# Patient Record
Sex: Female | Born: 1975 | Race: White | Hispanic: No | Smoking: Current every day smoker
Health system: Southern US, Academic
[De-identification: ages and names within clinical notes are randomized; demographics above are authoritative.]

## PROBLEM LIST (undated history)

## (undated) DIAGNOSIS — I639 Cerebral infarction, unspecified: Secondary | ICD-10-CM

## (undated) DIAGNOSIS — B977 Papillomavirus as the cause of diseases classified elsewhere: Secondary | ICD-10-CM

## (undated) DIAGNOSIS — G43909 Migraine, unspecified, not intractable, without status migrainosus: Secondary | ICD-10-CM

## (undated) DIAGNOSIS — R937 Abnormal findings on diagnostic imaging of other parts of musculoskeletal system: Secondary | ICD-10-CM

## (undated) DIAGNOSIS — G629 Polyneuropathy, unspecified: Secondary | ICD-10-CM

## (undated) DIAGNOSIS — C539 Malignant neoplasm of cervix uteri, unspecified: Secondary | ICD-10-CM

## (undated) DIAGNOSIS — R930 Abnormal findings on diagnostic imaging of skull and head, not elsewhere classified: Secondary | ICD-10-CM

## (undated) DIAGNOSIS — M62838 Other muscle spasm: Secondary | ICD-10-CM

## (undated) DIAGNOSIS — F411 Generalized anxiety disorder: Secondary | ICD-10-CM

## (undated) DIAGNOSIS — D649 Anemia, unspecified: Secondary | ICD-10-CM

## (undated) DIAGNOSIS — E7800 Pure hypercholesterolemia, unspecified: Secondary | ICD-10-CM

## (undated) DIAGNOSIS — R569 Unspecified convulsions: Secondary | ICD-10-CM

## (undated) DIAGNOSIS — N63 Unspecified lump in unspecified breast: Secondary | ICD-10-CM

## (undated) DIAGNOSIS — F32A Depression, unspecified: Secondary | ICD-10-CM

## (undated) HISTORY — PX: HX DILATION AND CURETTAGE: SHX78

## (undated) HISTORY — DX: Depression, unspecified: F32.A

## (undated) HISTORY — DX: Anemia, unspecified: D64.9

## (undated) HISTORY — DX: Abnormal findings on diagnostic imaging of skull and head, not elsewhere classified: R93.0

## (undated) HISTORY — DX: Unspecified convulsions: R56.9

## (undated) HISTORY — DX: Migraine, unspecified, not intractable, without status migrainosus: G43.909

## (undated) HISTORY — PX: DENTAL SURGERY: SHX609

## (undated) HISTORY — DX: Papillomavirus as the cause of diseases classified elsewhere: B97.7

## (undated) HISTORY — DX: Unspecified lump in unspecified breast: N63.0

## (undated) HISTORY — DX: Generalized anxiety disorder: F41.1

## (undated) HISTORY — DX: Cerebral infarction, unspecified: I63.9

## (undated) HISTORY — DX: Pure hypercholesterolemia, unspecified: E78.00

## (undated) HISTORY — DX: Polyneuropathy, unspecified: G62.9

## (undated) HISTORY — PX: VULVA SURGERY: SHX837

## (undated) HISTORY — DX: Abnormal findings on diagnostic imaging of other parts of musculoskeletal system: R93.7

---
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
## 2016-03-08 ENCOUNTER — Ambulatory Visit (INDEPENDENT_AMBULATORY_CARE_PROVIDER_SITE_OTHER): Payer: Self-pay | Admitting: Neurology

## 2016-06-21 ENCOUNTER — Ambulatory Visit: Payer: BC Managed Care – PPO | Attending: Neurology | Admitting: Neurology

## 2016-06-21 ENCOUNTER — Encounter (INDEPENDENT_AMBULATORY_CARE_PROVIDER_SITE_OTHER): Payer: Self-pay | Admitting: Neurology

## 2016-06-21 VITALS — BP 98/64 | HR 94 | Ht 59.0 in | Wt 130.3 lb

## 2016-06-21 DIAGNOSIS — F17210 Nicotine dependence, cigarettes, uncomplicated: Secondary | ICD-10-CM | POA: Insufficient documentation

## 2016-06-21 DIAGNOSIS — Z6826 Body mass index (BMI) 26.0-26.9, adult: Secondary | ICD-10-CM

## 2016-06-21 DIAGNOSIS — G43109 Migraine with aura, not intractable, without status migrainosus: Secondary | ICD-10-CM | POA: Insufficient documentation

## 2016-06-21 DIAGNOSIS — R51 Headache: Secondary | ICD-10-CM

## 2016-06-21 DIAGNOSIS — R519 Headache, unspecified: Secondary | ICD-10-CM

## 2016-06-21 NOTE — H&P (Signed)
[facility]  Outpatient History and Physical    Date:  06/21/2016  Name: Jasmine Brown  Age:  40 y.o.  Referring Physician:   Blanca Friend, MD  [address]  [place], [place] [address]    History of Present Illness  History obtained from:  patient    Jasmine Brown is a 40 y.o. female with h/o recurrent Bells palsy ( right side ), Herpes Zoster in her ear , post herpetic neuralgia , migrianes who presents with a chief complaint of   Chief Complaint   Patient presents with    New Patient    Headache      Type,nature and frequency of headache-  These headaches began approximately 25  year(s) ago.  They occur approximately 30 times per month and can last up to 4 days .   They are located on bilateral temporal, back of head and top of head and are described as a Stabbing pain  type of pain.She gets a stabbing type of pain as well behind her eye, lasts a few hours. She has right sided facial droop with the severe headaches. Slurred speech and word finding difficulty.  Intensity of the pain ranges from 10  /10   Association-There is associated Aura ( Zig zag lines )  ,photophobia,phonophobia,nausea and vomiting. Vomiting does not make the pain better .  Aggravating factors- smell of gas, Stress   Relieving factors -Sleep, Long showers  Caffeine intake-A lot of coffee(  pot of coffee) , Water-adequate water  intake , OTC medications-Naprosyn daily , Fiorecet   Sleep-Difficulty staying sleep  Medications tried in the past-Topamax 300 mg ( did not help the headaches ),Trokendi ER   ( made her have tingling in her arms and feet )  Pamelor 50 mg ( made her hair fall out ),Imitrex ( Made her sleep a lot )    Current medications-Neurontin 300 mg , Fioricet daily   F/H/O migraines-No  She had shingles in her ear 5 years that left her with post herpetic neuralgia of her face for which she takes daily Narco.    The patient reports: no other complaints.     Past Medical History  Current Outpatient  Prescriptions   Medication Sig    ALPRAZolam (XANAX) 1 mg Oral Tablet Take 1 mg by mouth Every night as needed for Insomnia    butalbital-cod-acetaminop-caf (FIORICET WITH CODEINE) 50-325-40-30 mg Oral Capsule Take 1 Cap by mouth Every 4 hours as needed for Headaches    gabapentin (NEURONTIN) 300 mg Oral Capsule Take 300 mg by mouth Once a day    HYDROcodone-acetaminophen (NORCO) 7.5-325 mg Oral Tablet Take 1 Tab by mouth Every 6 hours as needed for Pain    magnesium oxide (MAG-OX) 400 mg Oral Tablet Take 400 mg by mouth Once a day    MULTIVITAMIN (MULTIPLE VITAMINS ORAL) Take by mouth Once a day    naproxen (NAPROSYN) 500 mg Oral Tablet Take 500 mg by mouth Every 8 hours as needed for Pain    omeprazole (PRILOSEC) 20 mg Oral Capsule, Delayed Release(E.C.) Take 20 mg by mouth Once a day    tiZANidine (ZANAFLEX) 4 mg Oral Tablet Take 4 mg by mouth Once a day     Allergies   Allergen Reactions    Bactrim [Sulfamethoxazole-Trimethoprim]     Codeine     Furosemide     Hctz [Hydrochlorothiazide]     Maxidex [Dexamethasone]     Sulfa (Sulfonamides)  Triamterene     Trimethoprim      Past Medical History:   Diagnosis Date    Migraines          No past surgical history on file.      Family History   Problem Relation Age of Onset    Migraines Neg Hx          Social History     Social History    Marital status: Legally Separated     Spouse name: N/A    Number of children: N/A    Years of education: N/A     Social History Main Topics    Smoking status: Current Every Day Smoker     Types: Cigarettes    Smokeless tobacco: Never Used    Alcohol use Not on file    Drug use: Not on file    Sexual activity: Not on file     Other Topics Concern    Not on file     Social History Narrative    No narrative on file         Review of Systems:  Constitutional: Negative for fever, chills, and weakness.   Head/Eyes: Negative for vision changes, headaches or recent head trauma.   ENT: Negative for neck pain or  difficulty swallowing.   Cardiovascular: Negative for chest pain, palpitations and leg swelling.   Respiratory: Negative for experiencing shortness of breath or respiratory distress.   Gastrointestinal: Negative for nausea, vomiting, abdominal pain, diarrhea and constipation.   Renal/Urinary: Negative for dysuria or changes in urgency and frequency.  Reproductive: Negative for dyspareunia or decreased libido.  Skin: Negative for rashes and pruritis.   Musculoskeletal: Negative for joint or muscle pain.       Examination:    Vitals: BP 98/64   Pulse 94   Ht 1.499 m (4\' 11" )   Wt 59.1 kg (130 lb 4.7 oz)   SpO2 98%   BMI 26.32 kg/m2  General: appears in good health  HENT:Head atraumatic and normocephalic  Neck: No JVD or thyromegaly or lymphadenopathy  Carotids:Carotids normal without bruit  Lungs: Clear to auscultation bilaterally.   Cardiovascular: regular rate and rhythm  Abdomen: Soft, non-tender  Extremities: No cyanosis or edema  Ophthalomscopic: normal w/o hemorrhages, exudates, or papilledema  Mental status:  Level of Consciousness: alert  Orientations: Alert and oriented x 3  MemoryRegistration, Recall, and Following of commands is normal  AttentionsAttention and Concentration are normal  Knowledge: Good  Language: Normal  Speech: Normal  Cranial nerves:   CN2: Visual acuity and fields intact  CN 3,4,6: EOMI, PERRLA  CN 5Facial sensation intact  CN 7Face symmetrical  CN 8: Hearing grossly intact  CN 9,10: Palate symmetric and gag normal  CN 11: Sternocleidomastoid and Trapezius have normal strength.  CN 12: Tongue normal with no fasiculations or deviation  Gait, Coordination, and Reflexes:   Gait: Normal  Coordination: Coordination is normal without tremor    Muscle tone: WNL  Muscle exam  Arm Right Left Leg Right Left   Deltoid 5/5 5/5 Iliopsoas 5/5 5/5   Biceps 5/5 5/5 Quads 5/5 5/5   Triceps 5/5 5/5 Hamstrings 5/5 5/5   Wrist Extension 5/5 5/5 Ankle Dorsi Flexion 5/5 5/5   Wrist Flexion 5/5 5/5 Ankle  Plantar Flexion 5/5 5/5   Interossei 5/5 5/5 Ankle Eversion 5/5 5/5   APB 5/5 5/5 Ankle Inversion 5/5 5/5       Reflexes   RJ BJ TJ KJ  AJ Plantars Hoffman's   Right 2+ 2+ 2+ 2+ 2+ Downgoing Not present   Left 2+ 2+ 2+ 2+ 2+ Downgoing Not present     Sensory:Decreased pinprick in the glove and stocking distribution Normal color, texture and turgor without significant lesions or rashes  Diabetes Monitors:  Patient not a diabetic.      I have reviewed the following: outside records  MRI brain (   2013) as per report was normal.  Office notes from [facility] was reviewed.  Assessment and Plan    1. Migraine with aura    2. Facial pain      No orders of the defined types were placed in this encounter.  40 yr old female with h/o migraines with aura and facial pain after recurrent Bells palsy and Shingles in her right ear.     Migraine with aura  Mediation overuse HA  -Patient currently takes daily Narco , Fiorecet , Naproxen for her headaches and facial pain . As long as she takes multiple opiates and OTC medications, the chances of getting a rebound headache is very high. Counseled the patient to stop taking these medications as per a taper that can be suggested by her PCP and to call back to neurology  Office. Can start a migraine prophylaxis at that time.   -RTC as needed    Beau Fanny, MD 06/21/2016, 17:05    06/22/2016  I saw and examined the patient.  I reviewed the resident's note.  I agree with the findings and plan of care as documented in the resident's note.  Any exceptions/additions are noted.    Explained she would need to be of Narco, Fioricet and other abortives for two weeks then call me and we could start prophylaxis. Discussed the issue of CN's 5 and 7 and the unlikelihood of them recurring multiple times or being related with facial weakness and pain at the same time. She states she is not sure but does have thyroid disease.     Candie Chroman, MD 06/22/2016, 10:12

## 2016-06-23 ENCOUNTER — Observation Stay (HOSPITAL_COMMUNITY): Payer: Self-pay

## 2016-06-27 ENCOUNTER — Ambulatory Visit (INDEPENDENT_AMBULATORY_CARE_PROVIDER_SITE_OTHER): Payer: Self-pay | Admitting: Neurology

## 2016-06-27 NOTE — Telephone Encounter (Signed)
Called pt and lmom

## 2016-06-27 NOTE — Telephone Encounter (Signed)
Regarding: libbell  ----- Message from Lemont Fillers sent at 06/27/2016  9:37 AM EDT -----  Jasmine Brown is calling stating that she talked to her doctor and she is allergic to amniotripline.  Please contact her at [phone]

## 2016-06-27 NOTE — Telephone Encounter (Signed)
Please advise 

## 2016-06-27 NOTE — Telephone Encounter (Signed)
We did not start her on any medications so she would need to discuss that with the the doctor who Rx it. Please remind her I cannot do anything for her until she has been off all her medications for at least two weeks.

## 2017-11-07 ENCOUNTER — Ambulatory Visit (INDEPENDENT_AMBULATORY_CARE_PROVIDER_SITE_OTHER): Payer: Self-pay | Admitting: FAMILY PRACTICE

## 2018-01-09 ENCOUNTER — Ambulatory Visit: Payer: MEDICAID | Attending: FAMILY PRACTICE | Admitting: FAMILY PRACTICE

## 2018-01-09 ENCOUNTER — Encounter (INDEPENDENT_AMBULATORY_CARE_PROVIDER_SITE_OTHER): Payer: Self-pay | Admitting: FAMILY PRACTICE

## 2018-01-09 VITALS — BP 110/68 | HR 100 | Temp 98.1°F | Resp 18 | Ht 60.0 in | Wt 134.3 lb

## 2018-01-09 DIAGNOSIS — G43909 Migraine, unspecified, not intractable, without status migrainosus: Secondary | ICD-10-CM | POA: Insufficient documentation

## 2018-01-09 DIAGNOSIS — G4440 Drug-induced headache, not elsewhere classified, not intractable: Principal | ICD-10-CM | POA: Insufficient documentation

## 2018-01-09 DIAGNOSIS — F445 Conversion disorder with seizures or convulsions: Secondary | ICD-10-CM

## 2018-01-09 DIAGNOSIS — Z8673 Personal history of transient ischemic attack (TIA), and cerebral infarction without residual deficits: Secondary | ICD-10-CM | POA: Insufficient documentation

## 2018-01-09 DIAGNOSIS — G919 Hydrocephalus, unspecified: Secondary | ICD-10-CM | POA: Insufficient documentation

## 2018-01-09 DIAGNOSIS — I639 Cerebral infarction, unspecified: Secondary | ICD-10-CM

## 2018-01-09 DIAGNOSIS — Z889 Allergy status to unspecified drugs, medicaments and biological substances status: Secondary | ICD-10-CM

## 2018-01-09 DIAGNOSIS — B0229 Other postherpetic nervous system involvement: Secondary | ICD-10-CM | POA: Insufficient documentation

## 2018-01-09 DIAGNOSIS — F1320 Sedative, hypnotic or anxiolytic dependence, uncomplicated: Secondary | ICD-10-CM

## 2018-01-09 MED ORDER — MAGNESIUM 400 MG (AS MAGNESIUM OXIDE) TABLET
400.0000 mg | ORAL_TABLET | Freq: Every day | ORAL | Status: AC
Start: 2018-01-09 — End: ?

## 2018-01-09 MED ORDER — RIBOFLAVIN (VITAMIN B2) 400 MG TABLET
1.0000 | ORAL_TABLET | Freq: Every day | ORAL | Status: DC
Start: 2018-01-09 — End: 2022-04-25

## 2018-01-09 NOTE — Nursing Note (Signed)
Establish care. 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 She has 30+ drug allergies which she cannot list, will complete a record release. She is questioning a substitute for her Xanax. States Norco isn't effective either. She has also had worsening in vision and stated her eye Dr told her she had decreased tear ducts.

## 2018-01-09 NOTE — Patient Instructions (Addendum)
Xanax you have 1mg  and taking it tid right now. Decrease your dose to 1/2 three times a day for a week then 1/4 three times a day for a week then 1/4 tablet twice a day for three days then 1/4 pill for three days then stop    Stop Fiorocet  No otc headache meds  Stop norco    We will refer you back to neurology at [facility]       Rebound Headache     Overuse of pain medications can lead to rebound headaches.   You use pain medicinescalled analgesics to treat your headaches. You are now having more frequent or intense headaches(rebound headaches). This is your body's response to too much pain medicine. Prescription pain medicines can cause these headaches. But so can over-the-counter medicines like acetaminophen or ibuprofen. A drug that contains caffeine or butalbital is most likely to cause rebound headache.  Symptoms of rebound headache include:   Mild to moderate headache for 15 or more days each month in a person with pre-existing headache disorder   Headache when you wake up that continues most of the day   Headaches getting worse over time   Need for more and more medicine to treat headaches  Your healthcare provider can usually diagnose rebound headaches by your symptoms and medicine history. You may need tests to rule out other causes of your headaches. In the emergency room, you may be given a non-analgesic pain medicine to treat the pain or stop future headaches.  Home care  Treatment involves stopping use of your pain medicines. Your healthcare provider can tell you how to safely do this. You may be able to stop right away. Or you may need to take less and less over time (taper off). This will depend on the medicines you have been taking.To do this, follow the schedule that your provider gives you. If you are taking pain medicines for other types of pain at the same time, your healthcare provider may need other specialists to participate in your care.   For the first week or so after stopping, the  headaches will likely get worse. You may also have withdrawal symptoms. These often include nausea, vomiting, and trouble sleeping. You may be given a medicine to help relieve pain and withdrawal symptoms. Take this exactly as you have been told. It is vital to avoid taking daily pain medicine. If you do so, rebound headaches will continue.   Caffeine can make rebound headaches worse. If you have caffeinated drinks every day, slowly cut your intake.   Keep a written log of your headaches. This can help you and your healthcare provider track your progress.   Be patient. It can take about 2 to 6 months to stop having rebound headaches.   Once you have broken the headache cycle, be careful not to start it again. Work with your provider to make a treatment plan for headache pain that has low risk of causing rebound headaches.   Relaxation can help lower tension and relieve pain. Try a massage, meditation, yoga, or other relaxation techniques. Or make time for a relaxing hobby that you enjoy.  Follow-up care  Follow up with your healthcare provider, or as advised.  When to seek medical advice  Call your healthcare provider right away if any of these occur:   Fever of 100.56F (38C) or higher   Headaches that wake you from sleep   Repeated vomiting or visual problems that don't go away  Headache with a stiff neck, rash, confusion, weakness, numbness, seizure (convulsion), or trouble talking   Headache that starts after a head injury or fall   A type of headache you have never had before   Headache that gets worse despite rest and medicine   Date Last Reviewed: 02/25/2017   2000-2018 [employer]. [address] [place] [address]. All rights reserved. This information is not intended as a substitute for professional medical care. Always follow your healthcare professional's instructions.        Migraine Headache: Stages and Treatment    A migraine headache tends to progress in stages.  Learning these stages can help you better understand what is happening. Then you can learn ways to reduce pain and relieve other symptoms. Methods for relieving your symptoms include self-care and medicines.  Migraine stages  Migraines tend to progress through 4 stages. Many people don't have all stages, and stages may differ with each headache:   Prodrome. A few hours to a day or so before the headache, you may feel tired, (yawning many times),uneasy, or moody. You may also feel bloated or crave certain foods.   Aura. Up to an hour before the headache starts, some migraine sufferers experience aura--flashing lights, blind spots, other vision problems, confusion,difficulty speaking, or other neurologic symptoms.   Headache. Moderate to severe pain affects one side of the head and then can spread to both sides, often along with nausea. You may be highly sensitive to light, sound, and odors. Vomiting or diarrhea may also happen. This stage lasts 4 to 72 hours.   Postdrome. After your headache ends, you may feel tired, achy, and "washed out." This may last for a day or so.  Self-care during a migraine  Here is what you can do:   Use a cold compress. Wrap a thin cloth around a cold pack, a cold can of soda, or a bag of frozen vegetables. Apply this to your temple or other pain site.   Drink fluids. If nausea makes it hard to drink, try sucking on ice.   Rest. If possible, lie down. Try not to bend over, as this may increase your pain. Sometimes laying in a dark quiet room can help the migraine from being aggravated.   Try caffeine. Some people find that drinking fluids with caffeine, such as coffee or tea, helps to lessen migraine pain.  Using medicines  Work with your healthcare provider to find the rightmedicines for you. Medicines for migraine may relieve pain (analgesics), relieve nausea, or attack the migraine's root causes (migraine-specific medicines).  Rebound headache  Taking analgesics each day,  or even several times a week, may lead to more frequent and severe headaches. These are called rebound headaches. If you think you're having rebound headaches, tell your healthcare provider. He or she can help you safely decrease your medicine. Rebound caffeine withdrawal headaches can also happen. Certain medicines are addictive and can cause rebound headaches when discontinued abruptly.   Date Last Reviewed: 03/27/2017   2000-2018 [employer]. [address], [place] [address]. All rights reserved. This information is not intended as a substitute for professional medical care. Always follow your healthcare professional's instructions.        Migraines and Cluster Headaches  Migraines and cluster headaches cause intense, throbbing pain onone side of the head. With a migraine, you may have nausea and vomiting and be sensitive to light and sound. You may also have warning signs,  such as flashing lights or loss of parts of your vision, before the pain starts. This is called an aura. Migraines are 3 times more common in women than men. This may be due to hormonal changes during menstruation. Typical migraines may last for 4 to 72 hours untreated.  Cluster headaches recur in groups for days, weeks, or months. The pain is centered around or behindone eye. The eye may also become red or teary, or the eyelid may droop.Migraines and cluster headaches can have many triggers.    Preventing migraines and cluster headaches  Try the following steps:   Don't eat aged cheeses, nuts, beans, chocolate, red wine, or foods that contain caffeine, alcohol, tobacco, nitrates, and MSG.   Try not to skip meals.   Don't work in poor lighting.   Reduce stress as much as you can.   Get plenty of sleep each night.   Exercise regularly under your doctor's guidance.   Don't take headache medicines for more than 3 days, because of the risk of rebound headaches.   Don't take certain prescription medicines that are  known to cause rebound headaches.  Relieving the pain  Try these suggestions:   Stay quiet and rest.   Use cold to numb the pain. Wrap ice or a cold can of soda in a cloth. Hold it against the site of pain for10 minutes. Repeat every 20 minutes.   Stay out of the light. Wear dark glasses, turn out lights, and close the curtains. When outdoors, wear a brimmed hat.   Drink lots of fluids. Sip caffeine-free flat soda to help relieve nausea.   See your doctor if you get migraines or cluster headaches often. There are effective medicines to help treat or prevent them.   Hormone therapy. This may help women whose migraines are related to hormonal changes during menstruation.  Date Last Reviewed: 10/27/2016   2000-2018 [employer]. [address], [place] [address]. All rights reserved. This information is not intended as a substitute for professional medical care. Always follow your healthcare professional's instructions.

## 2018-01-09 NOTE — Progress Notes (Signed)
Winfall, DO  [address]  [place] [address]     Chief Complaint    Establish Care        History of Present Illness    This patient is a 42 y.o. 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 Sliced ear in two places thinking it was MRSA and drained but it was shingles  Was on topamax and switched to trokendii and it made her tingling and numbness lower ext (2016)  In 2017 she was being treated for migraines and neuro felt it was a brain tumor. Had MRI-  Every time she gets mri she gets bells palsy and a migraine  Took her down for a spinal tap- check for MS and a huge list of diseases.   Dx with hydrocephalus and when fluid was taen off during tap she felt great   Couple months later was bitten by a spider and got septic with MRSA  Started getting headaches and pressure in head the next June, started getting old headaches  Had allergic reaction to migraine shot in September 2018  She had a pseudoseizures. Had many for a week and then couldn't stand up or have control of her arm, She has having pain in her mid back at that time.   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   She has lots of mid spine pain and has lots of pain and falling a lot because her 'back gives out and becomes jello' and the falls   Her 'blood was too thin' so they could not check her csf (November)   They told her that 'she was addicted to the meds they prescribed her and she was having reaction because her of her xanax, norco"  They told her that they would withold her meds and  she would go into withdrawal in 48 hours.   She talked to Dr. Armanda Heritage and wanted to get off of the norco, xanax, and other meds but he told her it was not going to get better.     Sent her home with PT and home health and gave her a walker and     She has pain in her back and leg and can't' pick up a gallon of milk and put it on the counter. This has been going on for over a month.           Nursing Notes:   Theodoro Grist, LPN  38/18/29 [id number]  Signed  Establish care. Patient has extensive history - stroke at age 33 due to medication reaction, seizures, a tumor on her spine that was found at [facility], falls, and no bladder control. She has 30+ drug allergies which she cannot list, will complete a record release. She is questioning a substitute for her Xanax. States Norco isn't effective either. She has also had worsening in vision and stated her eye Dr told her she had decreased tear ducts.  Allergies    Allergies   Allergen Reactions   . Bactrim [Sulfamethoxazole-Trimethoprim]    . Codeine    . Furosemide    . Hctz [Hydrochlorothiazide]    . Maxidex [Dexamethasone]    . Sulfa (Sulfonamides)    . Triamterene    . Trimethoprim        Patient History    Past Medical History has been reviewed, confirmed, and as follows below.  History obtained from Patient and her female companion (maybe her husband but I did not inquire)    Past Medical History:   Diagnosis Date   . Migraines      Past Surgical History:   Procedure Laterality Date   . DENTAL SURGERY     . HX DILATION AND CURETTAGE       Family Medical History:     Problem Relation (Age of Onset)    Cancer Paternal Uncle    Diabetes Mother, Paternal Grandmother    Heart Attack Maternal Grandfather, Paternal Grandfather    Heart Surgery Father    High Cholesterol Father    Hypertension Mother, Father    Mental illness Mother    Other Maternal Grandfather, Paternal Grandmother    Seizures Maternal Grandmother    Stroke Mother, Maternal Grandmother, Maternal  Grandfather    Tremor Mother              Current Outpatient Medications:   .  ALPRAZolam (XANAX) 1 mg Oral Tablet, Take 1 mg by mouth Three times a day as needed for Insomnia , Disp: , Rfl:   .  butalbital-cod-acetaminop-caf (FIORICET WITH CODEINE) 50-325-40-30 mg Oral Capsule, Take 1 Cap by mouth Every 4 hours as needed for Headaches, Disp: , Rfl:   .  HYDROcodone-acetaminophen (NORCO) 7.5-325 mg Oral Tablet, Take 1 Tab by mouth Every 6 hours as needed for Pain, Disp: , Rfl:   .  magnesium oxide 400 mg magnesium Oral Tablet, Take 1 Tab (400 mg total) by mouth Once a day, Disp: , Rfl:   .  MULTIVITAMIN (MULTIPLE VITAMINS ORAL), Take by mouth Once a day, Disp: , Rfl:   .  omeprazole (PRILOSEC) 20 mg Oral Capsule, Delayed Release(E.C.), Take 20 mg by mouth Once a day, Disp: , Rfl:   .  riboflavin, vitamin B2, 400 mg Oral Tablet, Take 1 Tab (400 mg total) by mouth Once a day, Disp: , Rfl:   .  tiZANidine (ZANAFLEX) 4 mg Oral Tablet, Take 4 mg by mouth Once a day, Disp: , Rfl:   .  Topiramate (TOPAMAX) 200 mg Oral Tablet, TK 1 T PO QD, Disp: , Rfl: 3      Review of Systems    Review of systems was obtained and is unremarkable except as stated in HPI.    Vital Signs    Most Recent Vitals      Office Visit from 01/09/2018 in [facility], [place]     Temperature  36.7 C (98.1 F) filed at... 01/09/2018 1324   Heart Rate  100 filed at... 01/09/2018 1324   Respiratory Rate  18 filed at... 01/09/2018 1324   BP (Non-Invasive)  110/68 filed at... 01/09/2018 1324   Height  1.524 m (5') filed at... 01/09/2018 1324   Weight  60.9 kg (134 lb 4.8 oz) filed at... 01/09/2018 1324   BMI (Calculated)  26.28 filed at... 01/09/2018 1324   BSA (Calculated)  1.61 filed at... 01/09/2018 1324  Physical Exam    Constitutional 42 y.o. year old female, in no acute distress at time of exam.    Eyes PERRL, EOMI, no obvious sign of infection.  Top plate  External Ears, Nose, Oral cavity clear, mucosa moist  Neck  without mass or adenopathy, trachea midline.    Cardiovascular regular heart rate and rhythm, no murmur, gallops or rubs.  No edema.  Respiratory Respirations even and unlabored.   Lungs clear to auscultation bilaterally   Gastrointestinal Soft, non-tender, no distension, no obvious mass, organomegaly Integumentary No rash, no cyanosis   Psychiatric Alert and Oriented x 4 at office visit today.  Affect appropriate to content.  No obvious Psychopathology.    Hematologic/Lymphatic No Lymphadenopathy, no abnormal bruising.        Impressions/Plan      ICD-10-CM    1. Rebound headache G44.40    2. Post herpetic neuralgia B02.29    3. Cerebrovascular accident (CVA), unspecified mechanism (CMS [place]) I63.9    4. Migraines G43.909    5. 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 Multiple allergies Z88.9      Discussed the difference between seizures and pseudoseizures the patient it does seem that she probably was having pseudoseizures as those of the words that were used by Neurology will get her records and find out.  I am going to refer her back to Neurology for hydrocephalus and the pseudoseizures in the migraines specially given the complexity of her history.  I have discussed with her and put in writing about how she is going to titrate off her benzodiazepines as she states that the reason she has not is because no one ever told her how.  She also states that she did not understand that she was having rebound headaches and will try to avoid those medications.  I will refer her to Neurology titrate off the other medications she can follow up in 1-3 months  Will try to look through her old records to see what the "tumor on her spine is" to see what the next appropriate referral would be  Orders Placed This Encounter   . magnesium oxide 400 mg magnesium Oral Tablet   . riboflavin, vitamin B2, 400 mg Oral Tablet         Follow up   No follow-ups on  file.          Barnabas Harries, DO  01/09/2018, 14:24

## 2018-01-10 DIAGNOSIS — F445 Conversion disorder with seizures or convulsions: Secondary | ICD-10-CM | POA: Insufficient documentation

## 2018-01-10 DIAGNOSIS — R569 Unspecified convulsions: Secondary | ICD-10-CM | POA: Insufficient documentation

## 2018-01-10 DIAGNOSIS — F1320 Sedative, hypnotic or anxiolytic dependence, uncomplicated: Secondary | ICD-10-CM | POA: Insufficient documentation

## 2018-01-21 ENCOUNTER — Encounter (INDEPENDENT_AMBULATORY_CARE_PROVIDER_SITE_OTHER): Payer: Self-pay | Admitting: Neurology

## 2018-01-21 ENCOUNTER — Ambulatory Visit: Payer: MEDICAID | Attending: FAMILY PRACTICE | Admitting: Neurology

## 2018-01-21 DIAGNOSIS — G919 Hydrocephalus, unspecified: Secondary | ICD-10-CM | POA: Insufficient documentation

## 2018-01-21 DIAGNOSIS — I639 Cerebral infarction, unspecified: Secondary | ICD-10-CM | POA: Insufficient documentation

## 2018-01-21 DIAGNOSIS — F445 Conversion disorder with seizures or convulsions: Secondary | ICD-10-CM | POA: Insufficient documentation

## 2018-01-21 DIAGNOSIS — G43909 Migraine, unspecified, not intractable, without status migrainosus: Secondary | ICD-10-CM | POA: Insufficient documentation

## 2018-01-21 DIAGNOSIS — G4440 Drug-induced headache, not elsewhere classified, not intractable: Secondary | ICD-10-CM | POA: Insufficient documentation

## 2018-01-21 DIAGNOSIS — Z79899 Other long term (current) drug therapy: Secondary | ICD-10-CM | POA: Insufficient documentation

## 2018-01-21 MED ORDER — AMITRIPTYLINE 25 MG TABLET
25.00 mg | ORAL_TABLET | Freq: Every evening | ORAL | 1 refills | Status: DC
Start: 2018-01-21 — End: 2018-04-24

## 2018-01-21 NOTE — Progress Notes (Signed)
[facility] Department of Neurology  Operated by [facility]  Return Outpatient Note      Date:  01/21/2018  Patient Name: Jasmine Brown  MRN: [id number]  Age:  42 y.o.  Referring Physician:   Barnabas Harries, DO  [address]  [place], [place] [address]    CC: Migraine and Stroke      History obtained from:  Patient     HPI: This is 42 y.o F who presents for multiple neurological complaints but mainly migraine headache     Patient was not weaned from [place] till two weeks ago. She is in the process of weaning her self off Xanax as well. She gets severe headache once a week that last 2-3 days and associated with photophobia, phonophobia, and nausea. She appears to have recurrent aura of facial droop with her headache but she calls it recurrent bells palsy. She has been on Topamax 100 mg for over a year and it was increased to 200 mg qhs in Nov 2018 and still not helping. She was started on erenumab [employer]) and received two shots but she reports severe allergic reaction of decreased vision and severe muscle spasms so she is not taking it anymore. She is not sure if she tried amitriptyline or propranolol previously.    Patient has been seeing multiple providers and neurologists with many other complaints. 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 Her PCP is working on obtaining the records. She had 10-13 spells of seizure like activity and she is not sure whether diagnosed with pseudoseizures or not. She had a spinal tap in July 2018 due to concern for hydrocephalus and felt much better since the tap. Her MRI brain in 2013 per records (on media) looked fine with no evidence of enlarged ventricles or any other abnormalities. She reports having MRI brain and Thoracic spine in Nov 2018 but no images or records are available to review. She has tingling and numbness in all distal limbs and has gait difficulties/balance impairment if she  doesn't pay attention to her steps.         PHYSICALEXAM:    BP 120/84   Pulse 98   Ht 1.524 m (5')   Wt 59.6 kg (131 lb 6.3 oz)   LMP 12/26/2017   BMI 25.66 kg/m         Appearance: No Acute Distress  Ophthalmoscopic: Disc Flat, Normal fundus  Carotid/Heart/Peripheral Vascular: No Bruits,RRR  Mental status:  Orientation: Awake, Alert, and Oriented x 3  Memory: Registation 3/3 Recall 3/3  Attention: Normal  Knowledge: Appropriate  Language: No aphasia  Speech: No dysarthria  Cranial Nerves:  2 No Visual Defect on Confrontation, Pupils round, equal, reactive to light  3,4,6 Extraocular Movements Intact, no nystagmus  5 Facial SensationIntact  7 No facial asymmetry  8 Intact hearing  9,10 Palate symmetric, normal gag  11 Good shoulder shrug  12 Tongue Midline  Gait:Stable, swaying to both sides occasionally  Coordination: No ataxia with finger to nose testing, and heel to shin  Sensory: Intact, Symmetric to pinprick, light touch, vibration, and joint position  Muscle Tone: Normal              Muscle exam:  Arm Right Left Leg Right Left   Deltoid 5/5 5/5 Iliopsoas 5/5 5/5   Biceps 5/5 5/5 Quads 5/5 5/5   Triceps 5/5 5/5 Hamstrings 5/5 5/5  Wrist Extension 5/5 5/5 Ankle Dorsi Flexion 5/5 5/5   Wrist Flexion 5/5 5/5 Ankle Plantar Flexion 5/5 5/5   Interossei 5/5 5/5 Ankle Eversion 5/5 5/5   APB 5/5 5/5 Ankle Inversion 5/5 5/5       Reflexes   RJ BJ TJ KJ AJ Plantars Hoffman's   Right 2+ 2+ 2+ 2+ 2+ Downgoing Not present   Left 2+ 2+ 2+ 2+ 2+ Downgoing Not present     Personal review of             Diabetes Monitors  A1C - Glucose - Lipids Microalbumin   No results for input(s): HA1C, GLUCOSEFAST, CHOLESTEROL, HDLCHOL, LDLCHOL, LDLCHOLDIR, TRIG in the last 13140 hours. No results for input(s): MICALBRNUR, MICALBCRERAT in the last 13140 hours.     Diabetic foot exam:  Not diabetic         MRI Brain 2013 (on Media):  Unremarkable exam     Assessment/Plan:     ICD-10-CM    1. Cerebrovascular accident (CVA),  unspecified mechanism (CMS [place]) I63.9 Refer to [facility] Neurology   2. Migraines G43.909 Refer to [facility] Neurology   3. Hydrocephalus G91.9 Refer to [facility] Neurology   4. Rebound headache G44.40 Refer to [facility] Neurology   5. 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 amitriptyline (ELAVIL) 25 mg Oral Tablet     This is 42 y.o F who presents for multiple neurological complaints but mainly migraine headache.      Chronic migraine with aura:   - patient appears to have an aura of facial droop with her severe episodes   - will start Amitriptyline 25 mg qhs for prevention. Discussed side effects including dry mouth and drowsiness   - continue magnesium and riboflavin   - can stop Topamax immediately unless it's prescribed for another reason like mood stabilizer or other indication, then she needs to discuss with the prescriber how to wean her self of it   - continue to wean her self off Xanax and to never take Norco or Fioricet for headaches  - instructed to call if symptoms worsen or fail to improve     Multiple neurological complaints:   - patient exam is benign and she was diagnosed with multiple conditions that don't seem to be correct   - she has enhanced physiologic tremors, no evidence of essential tremors or any movement disorder   - might consider EMG/NCS if she persists to have tingling, numbness, and sensory ataxia even with controlling her headaches   - PCP is working on obtaining MRI T spine and MRI brain images/records, will follow on them     Return to clinic in 3 months      Bonita Quin, MD 01/21/2018, 15:00    01/22/2018  I saw and examined the patient.  I reviewed the resident's note.  I agree with the findings and plan of care as documented in the resident's note.  Any exceptions/additions are noted.    Numerous complaints that cannot be associated. She has concerns about tumors on outside thoracic MRI that have not been followed up. By her description it is in the  vertebral body and most likely benign. She states her PCP is tracking down these films and I explained we would look at them to see if further work up is needed. For now we are going to try and treat her headaches again. She states she has been of narcotic medications  for at least two weeks as we had discussed previously. I have given her a thorough explanation of headache etiology and their management including abortive and prophylactic therapy. We are starting amitriptyline 25 mg daily. Potential risks and interactions were explained. I explained what to expect and when to call. She will follow up in 3 months.     Candie Chroman, MD 01/22/2018, 10:26

## 2018-02-07 ENCOUNTER — Encounter (INDEPENDENT_AMBULATORY_CARE_PROVIDER_SITE_OTHER): Payer: Self-pay | Admitting: FAMILY PRACTICE

## 2018-02-27 ENCOUNTER — Encounter (INDEPENDENT_AMBULATORY_CARE_PROVIDER_SITE_OTHER): Payer: Self-pay | Admitting: FAMILY PRACTICE

## 2018-04-08 ENCOUNTER — Ambulatory Visit (INDEPENDENT_AMBULATORY_CARE_PROVIDER_SITE_OTHER): Payer: Self-pay | Admitting: Neurology

## 2018-04-08 NOTE — Telephone Encounter (Signed)
-----   Message from Hassie Bruce sent at 04/08/2018  1:46 PM EDT -----  Dr Knox Royalty, pt was wanting to know if you had gotten the mri results on her from the [place] also said she is now able to walk without a cane or a walker.  Is very excited.  thanks

## 2018-04-08 NOTE — Telephone Encounter (Addendum)
Called pt. She said she had a mri brain done 09/2017 - in the [employer] scanned material.  She is also going to run out of elavil before appt. Please erx  Please advise

## 2018-04-08 NOTE — Telephone Encounter (Signed)
MRI 10/09/17 was normal by report, but I was not expecting any abnormalities. She got 90 day supple of amitriptyline with a refill at the February visit. Please just tell her to refill it.

## 2018-04-08 NOTE — Telephone Encounter (Signed)
Please advise 

## 2018-04-08 NOTE — Telephone Encounter (Signed)
We do have a report of a thoracic MRI that mentions a spot at T5 vertebral body, but it looks like she had a follow up Nuclear Medicine Scan for it. Bone lesions are not in my area so I cannot tell her if the work up is complete. She would want to clarify this with her PCP or the doctor who ordered the tests.

## 2018-04-09 NOTE — Telephone Encounter (Signed)
Called pt and informed  She will check at pharmacy for script from Feb

## 2018-04-24 ENCOUNTER — Encounter (INDEPENDENT_AMBULATORY_CARE_PROVIDER_SITE_OTHER): Payer: Self-pay | Admitting: Neurology

## 2018-04-24 ENCOUNTER — Ambulatory Visit: Payer: BC Managed Care – PPO | Attending: Neurology | Admitting: Neurology

## 2018-04-24 VITALS — BP 124/82 | HR 98 | Wt 136.5 lb

## 2018-04-24 DIAGNOSIS — Z6826 Body mass index (BMI) 26.0-26.9, adult: Secondary | ICD-10-CM

## 2018-04-24 DIAGNOSIS — G43119 Migraine with aura, intractable, without status migrainosus: Secondary | ICD-10-CM

## 2018-04-24 DIAGNOSIS — Z79899 Other long term (current) drug therapy: Secondary | ICD-10-CM | POA: Insufficient documentation

## 2018-04-24 MED ORDER — AMITRIPTYLINE 50 MG TABLET
50.00 mg | ORAL_TABLET | Freq: Every evening | ORAL | 3 refills | Status: DC
Start: 2018-04-24 — End: 2018-04-25

## 2018-04-24 NOTE — Progress Notes (Signed)
Date:  04/24/2018  Name: Jasmine Brown  Age:  42 y.o.  Referring Physician:   No referring provider defined for this encounter.    Chief Complaint:   Chief Complaint   Patient presents with   . Headache   . Facial Droop     sometomes at the [place] of her headaches   . Muscle Cramps   . Numbness       History obtained from: patient and husband    History of Present Illness  Headaches are no better. She has facial symptoms during her headaches and showed me a video. She was not clear why she did not relay this information with her last message.      Outpatient Medications Marked as Taking for the 04/24/18 encounter (Office Visit) with Candie Chroman, MD   Medication Sig   . ALPRAZolam (XANAX) 1 mg Oral Tablet Take 1 mg by mouth Three times a day as needed for Insomnia (weaning off)    . amitriptyline (ELAVIL) 50 mg Oral Tablet Take 1 Tab (50 mg total) by mouth Every night   . magnesium oxide 400 mg magnesium Oral Tablet Take 1 Tab (400 mg total) by mouth Once a day   . MULTIVITAMIN (MULTIPLE VITAMINS ORAL) Take by mouth Once a day   . riboflavin, vitamin B2, 400 mg Oral Tablet Take 1 Tab (400 mg total) by mouth Once a day     Allergies   Allergen Reactions   . Bactrim [Sulfamethoxazole-Trimethoprim]    . Codeine    . Furosemide    . Hctz [Hydrochlorothiazide]    . Maxidex [Dexamethasone]    . Sulfa (Sulfonamides)    . Triamterene    . Trimethoprim        Examination:  BP 124/82   Pulse 98   Wt 61.9 kg (136 lb 7.4 oz)   SpO2 100%   BMI 26.65 kg/m       Exam reveals a patient in no apparent distress.  Orientation is normal.  Memory is normal.  Attention is normal.  Knowledge is normal.  Language: no aphasia  Speech: no dysarthria           Gait: No ataxia  Coordination: No dysmetria    Assessment    ICD-10-CM    1. Intractable migraine with aura without status migrainosus G43.119 amitriptyline (ELAVIL) 50 mg Oral Tablet       Plan    Total face-to-face time by staff: 20 minutes. Greater than 50% of that time was spent on  counseling/coordination of care regarding: I have again given her a thorough explanation of headache etiology and their management including abortive and prophylactic therapy. We are going to increase the dose to 50 mg daily, but she will call in a moth to switch to zonisamide if there is not a big improvement. We discussed zonisamide and potential risks and interactions were explained in case we do need to make the switch. We will schedule follow up for three months.     Candie Chroman, M.D.  Associate Professor  Department of Neurology

## 2018-04-24 NOTE — Patient Instructions (Signed)
Call in a month if headaches are not

## 2018-04-25 ENCOUNTER — Ambulatory Visit (INDEPENDENT_AMBULATORY_CARE_PROVIDER_SITE_OTHER): Payer: Self-pay | Admitting: Neurology

## 2018-04-25 DIAGNOSIS — G43119 Migraine with aura, intractable, without status migrainosus: Secondary | ICD-10-CM

## 2018-04-25 DIAGNOSIS — R519 Headache, unspecified: Secondary | ICD-10-CM

## 2018-04-25 DIAGNOSIS — R51 Headache: Secondary | ICD-10-CM

## 2018-04-25 MED ORDER — ZONISAMIDE 100 MG CAPSULE
100.0000 mg | ORAL_CAPSULE | Freq: Every day | ORAL | 3 refills | Status: DC
Start: 2018-04-25 — End: 2018-04-26

## 2018-04-25 NOTE — Telephone Encounter (Signed)
Talked to pt and informed

## 2018-04-25 NOTE — Telephone Encounter (Signed)
Pt called back and stated that she is so sorry here she is already taking 50 mg the the elavil . She is not sure if you want to increase it or try something else . Please advise

## 2018-04-25 NOTE — Telephone Encounter (Signed)
OK, I see it. Please ask her to pick up zonisamide. I explained the medication to her yesterday. One tablet daily. She can stop the amitriptyline.

## 2018-04-25 NOTE — Telephone Encounter (Signed)
Please advise 

## 2018-04-25 NOTE — Telephone Encounter (Signed)
-----   Message from [place] sent at 04/25/2018 10:35 AM EDT -----  Pt called and stated that she has some questions about amitriptyline (ELAVIL) 50 mg Oral Tablet. Please call to discuss. Thanks

## 2018-04-25 NOTE — Telephone Encounter (Signed)
No we only ever gave her 25 mg and did not increase it until yesterday. There should be no additional concerns for the increase.

## 2018-04-25 NOTE — Telephone Encounter (Signed)
The script from feb was 1 tab for 7 days and then 2 tabs there after . So that is what she has been doing . Looks like kabbani did script  .

## 2018-04-25 NOTE — Telephone Encounter (Signed)
Regarding: medication   ----- Message from Sarina Ser sent at 04/25/2018  2:38 PM EDT -----  Pharmacist states that they received a prescription for the pt's zonisamide (ZONEGRAN) 100 mg Oral Capsule, but he states that the pt has a sulfa allergy and this medication comes up as a sulfa medication. He states that they need to know what the Dr wants to do about this. Please contact pharmacy to advise. Thanks      Preferred Pharmacy    [facility] [place], [place] - [place]    [place] [address]    Phone: [phone] Fax: [phone]    Not a 24 hour pharmacy; exact hours not known.

## 2018-04-26 NOTE — Telephone Encounter (Signed)
0915 Left message I called with patinet. Asked for time and number to call during day.

## 2018-04-26 NOTE — Telephone Encounter (Signed)
10 Explained the potential of cross reactivity with the patient. She has had numerous allergies so suggested she not take it even though it is not a common problem. Discussed possibilities and decided to have be seen in the Headache Clinic.

## 2018-05-06 ENCOUNTER — Encounter (INDEPENDENT_AMBULATORY_CARE_PROVIDER_SITE_OTHER): Payer: Self-pay | Admitting: Neurology

## 2018-05-06 ENCOUNTER — Ambulatory Visit (INDEPENDENT_AMBULATORY_CARE_PROVIDER_SITE_OTHER): Payer: Self-pay | Admitting: Neurology

## 2018-05-06 NOTE — Telephone Encounter (Signed)
[name], [name], MD          Since coming off the amitriptyline, my body has started regressing back to the state before then. Losing use of my legs, my bladder, my face and hand, blurred vision. I have called in and picking up the amitriptyline. I have received the papers from theheadache clinic and will be seeing my pcp tomorrow.     Thank you,   Jasmine Brown

## 2018-07-24 ENCOUNTER — Encounter (INDEPENDENT_AMBULATORY_CARE_PROVIDER_SITE_OTHER): Payer: Self-pay | Admitting: Neurology

## 2018-07-25 ENCOUNTER — Ambulatory Visit (INDEPENDENT_AMBULATORY_CARE_PROVIDER_SITE_OTHER): Payer: Self-pay | Admitting: Neurology

## 2018-07-25 NOTE — Telephone Encounter (Signed)
Does pt even need to have RTC with you on 9/11? She sees HA clinic on 9/3

## 2018-07-25 NOTE — Telephone Encounter (Signed)
[name], [name]         I just got a message I have an appointment on the 11th I have an appointment at the [place] migraine clinic on the 3rd anyway I can get this one the same day. I live 4 hours away from there.     Thank you   Jasmine Brown

## 2018-07-26 ENCOUNTER — Encounter (INDEPENDENT_AMBULATORY_CARE_PROVIDER_SITE_OTHER): Payer: Self-pay

## 2018-07-26 NOTE — Telephone Encounter (Signed)
Just keep the Headache apt. It looks like the one with me never got canceled.

## 2018-07-30 ENCOUNTER — Ambulatory Visit: Payer: BC Managed Care – PPO | Attending: Specialist | Admitting: Specialist

## 2018-07-30 ENCOUNTER — Encounter (INDEPENDENT_AMBULATORY_CARE_PROVIDER_SITE_OTHER): Payer: Self-pay | Admitting: Specialist

## 2018-07-30 VITALS — BP 110/74 | HR 113 | Ht 60.0 in | Wt 141.5 lb

## 2018-07-30 DIAGNOSIS — Z6827 Body mass index (BMI) 27.0-27.9, adult: Secondary | ICD-10-CM

## 2018-07-30 DIAGNOSIS — R519 Headache, unspecified: Secondary | ICD-10-CM

## 2018-07-30 DIAGNOSIS — G43719 Chronic migraine without aura, intractable, without status migrainosus: Secondary | ICD-10-CM | POA: Insufficient documentation

## 2018-07-30 DIAGNOSIS — G43109 Migraine with aura, not intractable, without status migrainosus: Secondary | ICD-10-CM | POA: Insufficient documentation

## 2018-07-30 DIAGNOSIS — R51 Headache: Secondary | ICD-10-CM

## 2018-07-30 DIAGNOSIS — Z79899 Other long term (current) drug therapy: Secondary | ICD-10-CM | POA: Insufficient documentation

## 2018-07-30 DIAGNOSIS — G4440 Drug-induced headache, not elsewhere classified, not intractable: Secondary | ICD-10-CM | POA: Insufficient documentation

## 2018-07-30 MED ORDER — ZOLMITRIPTAN 5 MG TABLET
ORAL_TABLET | ORAL | 11 refills | Status: DC
Start: 2018-07-30 — End: 2019-03-12

## 2018-07-30 MED ORDER — PREDNISONE 20 MG TABLET
ORAL_TABLET | ORAL | 0 refills | Status: DC
Start: 2018-07-30 — End: 2019-03-12

## 2018-07-30 MED ORDER — AMITRIPTYLINE 25 MG TABLET
ORAL_TABLET | ORAL | 5 refills | Status: DC
Start: 2018-07-30 — End: 2019-03-12

## 2018-07-30 NOTE — Progress Notes (Signed)
RE:  Jasmine Brown          January 11, 1976    Dear Dr. Glennon Hamilton Alfredo Martinez, MD:     I had the pleasure of seeing Jasmine Brown at the [facility] on 07/30/2018 for the evaluation of headaches.  As you are aware, she is a 42 y.o. year old female who comes to clinic with a complaint of headaches.  These headaches began approximately 28 years ago.  They occur approximately 7 times per week (with a rare headache free day) with one per week very severe. They are located on frontal, back of head and top of head and are described as a squeezing, pressure, boring type of pain.  They are Severe in intensity .  At worst they are 10/10 and average 3/10.  With her most severe headaches she will have blurred vision, numbness in her hands, numbness in her face, weakness in her face, weakness in her left hand. Associated symptoms include aggravation by movement, nausea, photophobia, phonophobia and vomiting.  The pain is triggered by smell of gasoline, stress.  The patient is currently treating her headaches with elavil 50mg , magnesium 400mg  daily, Vitamin B2 100mg , ibuprofen, naproxen, excedrin (lots of these last three).  Past medications tried include nortriptyline and topiramate,sumatriptan, rizatriptan, midrin and butalbital.   There is a family history of headaches.  She had an LP 2 years ago.  She reports that she felt amazingly better afterward after taking 16cc's.  She reports going months without migraine afterward.  MIDAS: 186  HIT6: 66    She  has a past medical history of Migraines.      Allergies: Bactrim [sulfamethoxazole-trimethoprim]; Codeine; Furosemide; Hctz [hydrochlorothiazide]; Maxidex [dexamethasone]; Sulfa (sulfonamides); Triamterene; and Trimethoprim     Medications:  Current Outpatient Medications   Medication Sig Dispense Refill   . ALPRAZolam (XANAX) 1 mg Oral Tablet Take 1 mg by mouth Three times a day as needed for Insomnia (weaning off)      . amitriptyline (ELAVIL) 25 mg Oral Tablet 3 qhs 90 Tab 5   .  magnesium oxide 400 mg magnesium Oral Tablet Take 1 Tab (400 mg total) by mouth Once a day     . MULTIVITAMIN (MULTIPLE VITAMINS ORAL) Take by mouth Once a day     . predniSONE (DELTASONE) 20 mg Oral Tablet 3/d x 4d, then 2/d x 4d, then 1/d x 4d, then 1/2 x 4d 26 Tab 0   . riboflavin, vitamin B2, 400 mg Oral Tablet Take 1 Tab (400 mg total) by mouth Once a day     . tiZANidine (ZANAFLEX) 4 mg Oral Tablet Take 4 mg by mouth Three times a day     . Zolmitriptan (ZOMIG) 5 mg Oral Tablet Take at onset of headache, may repeat in 2 hours if needed.  Max 2/d, max 3d/wk 9 Tab 11     No current facility-administered medications for this visit.      Her family history includes Cancer in her paternal uncle; Diabetes in her mother and paternal grandmother; Heart Attack in her maternal grandfather and paternal grandfather; Heart Surgery in her father; High Cholesterol in her father; Hypertension (High Blood Pressure) in her father and mother; Mental illness in her mother; Other in her maternal grandfather and paternal grandmother; Seizures in her maternal grandmother; Stroke in her maternal grandfather, maternal grandmother, and mother; Tremor in her mother.  She  reports that she has been smoking cigarettes. She has been smoking about 1.00 pack per day.  She has never used smokeless tobacco. She reports that she drank alcohol.     Review of systems was positive for inadequate sleep, back pain, some constipation.  No fever, chills, chest pain, shortness of breath.  The rest of 10 systems have been reviewed and are negative except as per the HPI.    Vitals:    07/30/18 1435   BP: 110/74   Pulse: (!) 113   SpO2: 97%   Weight: 64.2 kg (141 lb 8.6 oz)   Height: 1.524 m (5')   BMI: 27.7         Physical Exam revealed pt to be normocephalic/atraumatric with normal heart sounds and rate, clear respirations, and no carotid bruits.  Extremity exam showed no edema.  Ophthalmologic exam showed no papilledema.  Neurologic exam:  Pt had  intact cognition with intact orientation, good knowledge, good recent and remote memory, full language, clear speech, and normal attention.  Cranial nerve exam showed pupils to equal and reactive, normal conjugate eye movements, normal facial sensation and symmetric facial muscles.  Hearing was intact bilaterally, shoulder shrug was strong, and tongue protruded in the midline with normal palatal elevation.  Patient had normal, steady, narrow gait, intact finger-nose testing, and symmetrically intact sensation throughout.  The upper and lower extremities showed normal tone and strength, including deltoids, biceps, grip, hip flexors, knee extension, and ankle flexion.  Reflexes were symmetric in upper and lower extremities without pathologic reflexes.     I have reviewed her records as outlined below.  Notes from Dr Knox Royalty    My impression and plan are as follows:    ICD-10-CM    1. Intractable chronic migraine without aura G43.719 predniSONE (DELTASONE) 20 mg Oral Tablet     amitriptyline (ELAVIL) 25 mg Oral Tablet     Zolmitriptan (ZOMIG) 5 mg Oral Tablet   2. Daily headache R51 Referral to Neurology   3. Migraine with aura G43.109 predniSONE (DELTASONE) 20 mg Oral Tablet     amitriptyline (ELAVIL) 25 mg Oral Tablet     Zolmitriptan (ZOMIG) 5 mg Oral Tablet   4. Medication overuse headache G44.40 predniSONE (DELTASONE) 20 mg Oral Tablet     amitriptyline (ELAVIL) 25 mg Oral Tablet     Zolmitriptan (ZOMIG) 5 mg Oral Tablet      She has migraine with aura which has a motor component by her description.  She has severe medication overuse.  I am not sure what to make of the LP story and do not have her opening pressure.  My exam today did not reveal papilledema.  She needs to stop OTCs completely.  Will take prednisone for 16 days to prevent worsening.  She will increase elavil to 75mg .  Increase magnesium to 400mg  bid.  Use zomig as needed (there is not a sulfur in zomig).  Tizanidine she can be more liberal with as a  rescue.  Offered admission if needed.  May repeat LP if symptoms persist.    Total face-to-face time by staff:  50 minutes. Greater than 50% of that time was spent on counseling/coordination of care regarding:   Plan of care, options.    Donovan Kail, MD 07/30/2018, 15:17      Zella Richer. [name] MD  Director, [facility]  Associate Professor of Neurology  [facility]

## 2018-07-30 NOTE — Patient Instructions (Signed)
STOP ibuprofen, naproxen, and excedrin    Prednisone 20mg : 3 pills nightly for 4 days, then 2 nightly for 4 days, the 1 nightly for 4 days, then 1/2 nightly for 4 days, then stop    Increase amitriptyline to 75mg  (3 pills of 25mg ) nightly.    Increase magnesium to 400mg  twice per day    Use zolmitriptan 5mg  as needed - max 3 days per week.    Use tizanidine as needed when all else fails.              What Can You Do For Your Headaches    The management of headache disorders consists of many factors.  Medications are only part of the solution, and alone will be limited in effectiveness.  There are many other factors that will be your responsibility to manage.  It will likely only be through our combined efforts that you will achieve satisfactory improvement in your head pain and quality of life.  Below are some suggestions of how you can help yourself and help the Headache Center help you.    1. Unless told otherwise, you should discontinue the use of any over-the-counter (OTC) medication that you use for headaches.  OTC's can quickly backfire and lead to increased frequency of headaches, even when used only once or twice per week.  2. Unless told otherwise, you should discontinue the use of any prescription pain medication such as opiates (narcotics) or fiorinal/fioricet for the same reason as above.  3. Getting regular, refreshing sleep is very important.  Set a specific bedtime that would allow for 8 to 8  hours of sleep.  Do not watch television, listen to the radio, do work, or argue in bed.  If your bed partner snores, wear earplugs or sleep in another room.  Have the same bedtime on the weekend as you have during the week.  4. Eat a well-balanced and healthy diet.  Avoid common food triggers like Monosodium Glutamate (MSG) and Nitrites.  These are commonly found in snack foods like potato chips, hot dogs, bacon, and other processed meats.  Decrease your intake of red meat and increase chicken or fish.  Drink  6-8 glasses of water per day.  5. Get regular exercise.  A brisk walk for 30 minutes 3-4 days per week is enough at the beginning.  Improving your heart health and improving your blood flow will have beneficial effects on your headaches.  6. Eliminate Caffeine from your diet.  Caffeine is one of the worst chemicals for anyone with headaches.  Even only one cup per day can be enough to increase the frequency or severity of your headaches.  Be aware that even decaffeinated coffee and tea have some caffeine in them.  7.  Find someone with whom you can talk about your headaches and other stresses or concerns.  This could be a religious leader, Medical illustrator, psychologist, or even just a good friend.  It is important to be able to talk openly and honestly about what you are going through.  8. Begin a stress reduction program with meditation, prayer, or biofeedback.  Many resources for biofeedback and relaxation are available on the internet.  64. Being involved in your regular activities despite the headaches is necessary.  Make every effort to go to work, school, or social activities even if you have a headache.  You cannot let your headaches control you - you must control them and not allow them to dictate to you how you  will live.  10. Avoid "Natural" cures for your headache unless you discuss them with the Headache Center first.  Many products claiming to cure or treat headaches can actually be detrimental to your headaches.

## 2018-08-07 ENCOUNTER — Encounter (INDEPENDENT_AMBULATORY_CARE_PROVIDER_SITE_OTHER): Payer: Self-pay | Admitting: Neurology

## 2018-08-13 ENCOUNTER — Encounter (INDEPENDENT_AMBULATORY_CARE_PROVIDER_SITE_OTHER): Payer: Self-pay | Admitting: Specialist

## 2018-08-14 NOTE — Telephone Encounter (Signed)
Zolmitriptan (ZOMIG) 5 mg Oral Tablet 9 Tab 11 07/30/2018     I assume she means all 9 tabs, and not all 11 refills  She has used 9 in 2 weeks  Please advise

## 2018-08-14 NOTE — Telephone Encounter (Signed)
Regarding: Prescription Question  ----- Message from Abran Duke, RN sent at 08/14/2018  7:14 AM EDT -----       ----- Message from [place], Mariella to Donovan Kail, MD sent at 08/13/2018  3:46 PM -----   I realized today when I had to clock out and take my zomig that I have taken all 11 since prescribed. Should I need that much??

## 2018-08-14 NOTE — Telephone Encounter (Signed)
-----   Message from [place] sent at 08/13/2018  3:46 PM EDT -----  Regarding: Prescription Question  Contact: [phone]  I realized today when I had to clock out and take my zomig that I have taken all 11 since prescribed. Should I need that much??

## 2018-08-23 ENCOUNTER — Telehealth (INDEPENDENT_AMBULATORY_CARE_PROVIDER_SITE_OTHER): Payer: Self-pay | Admitting: Specialist

## 2018-08-23 ENCOUNTER — Encounter (INDEPENDENT_AMBULATORY_CARE_PROVIDER_SITE_OTHER): Payer: Self-pay | Admitting: Specialist

## 2018-08-23 NOTE — Telephone Encounter (Signed)
-----   Message from [facility] sent at 08/23/2018  8:03 AM EDT -----  Regarding: Prescription Question  Contact: [phone]  Either my migraines are just forever going to be there or I think I need something adjusted. I had to miss work this week. My head is killing me not counting my left side going out. The zomig they cannot fill for almost 3 weeks. I have one left. My vision did come back yesterday but today feels like my head is going to explode. I dont know whT to do.

## 2018-08-28 ENCOUNTER — Telehealth (INDEPENDENT_AMBULATORY_CARE_PROVIDER_SITE_OTHER): Payer: Self-pay | Admitting: Family

## 2018-08-28 MED ORDER — HYDROXYZINE PAMOATE 25 MG CAPSULE
ORAL_CAPSULE | ORAL | 5 refills | Status: DC
Start: 2018-08-28 — End: 2019-03-12

## 2018-08-28 NOTE — Telephone Encounter (Signed)
-----   Message from [facility] sent at 08/28/2018  2:07 PM EDT -----  Regarding: RE:(No subject)  Contact: [phone]  Thank you! I'm not sure what it is, but anything that will stop this I'm forever grateful!  ----- Message -----  From: Jodi Geralds, APRN,FNP-BC  Sent: 08/28/2018  2:05 PM EDT  To: Flossie Buffy  Subject: (No subject)  Hi,     Lets try hydroxyzine as needed.  I will send a script to the pharmacy.    Thanks  [employer]

## 2018-08-28 NOTE — Telephone Encounter (Signed)
-----   Message from [facility] sent at 08/28/2018 10:09 AM EDT -----  Regarding: RE:(No subject)  Contact: [phone]  [name],   My insurance will only cover so many zomig a month. So they say I have to wait till the 11th to refill. And I sit here trying to read with the right nostril dripping (something Ive noticed, it comes when the aruas start and my vision changes. I dont know what to do. I cant keep missing work. And normally when the first start and pain starts I would take my medication, I cant. Please tell me what to do... peppermint oil doesnt work. Im so sorry!  ----- Message -----  From: Jodi Geralds, APRN,FNP-BC  Sent: 08/14/2018  8:08 AM EDT  To: Jasmine Brown  Subject: (No subject)  It may take a little while to see benefit from the increase in the magnesium and elavil from the prevention side.  But at this time it is important to only use the zomig for the severe headaches. Also be sure to stop th eover the counter medications as these can impede improvement in the headaches.    Thanks!

## 2018-09-01 ENCOUNTER — Other Ambulatory Visit (INDEPENDENT_AMBULATORY_CARE_PROVIDER_SITE_OTHER): Payer: Self-pay | Admitting: Neurology

## 2018-09-01 DIAGNOSIS — G43119 Migraine with aura, intractable, without status migrainosus: Secondary | ICD-10-CM

## 2018-09-02 NOTE — Telephone Encounter (Signed)
Review.

## 2018-09-25 ENCOUNTER — Encounter (INDEPENDENT_AMBULATORY_CARE_PROVIDER_SITE_OTHER): Payer: Self-pay | Admitting: Specialist

## 2018-09-30 ENCOUNTER — Encounter (INDEPENDENT_AMBULATORY_CARE_PROVIDER_SITE_OTHER): Payer: Self-pay | Admitting: Specialist

## 2018-09-30 ENCOUNTER — Ambulatory Visit (INDEPENDENT_AMBULATORY_CARE_PROVIDER_SITE_OTHER): Payer: Self-pay | Admitting: Specialist

## 2018-09-30 NOTE — Nursing Note (Signed)
LMOM. Will await callback.      Lenord Carbo, RN  09/30/2018, 14:35

## 2018-09-30 NOTE — Telephone Encounter (Signed)
Spoke with patient on the phone regarding HIFU and headaches. Updated Dr. [name] in person.    Lenord Carbo, RN  09/30/2018, 15:19

## 2018-12-02 ENCOUNTER — Encounter (INDEPENDENT_AMBULATORY_CARE_PROVIDER_SITE_OTHER): Payer: Self-pay | Admitting: Specialist

## 2018-12-30 ENCOUNTER — Other Ambulatory Visit: Payer: Self-pay

## 2018-12-30 ENCOUNTER — Encounter (INDEPENDENT_AMBULATORY_CARE_PROVIDER_SITE_OTHER): Payer: Self-pay | Admitting: Specialist

## 2018-12-30 ENCOUNTER — Ambulatory Visit: Payer: BC Managed Care – PPO | Attending: Specialist | Admitting: Specialist

## 2018-12-30 ENCOUNTER — Ambulatory Visit (INDEPENDENT_AMBULATORY_CARE_PROVIDER_SITE_OTHER): Payer: Self-pay | Admitting: Specialist

## 2018-12-30 VITALS — BP 134/86 | HR 123 | Ht 60.0 in | Wt 168.0 lb

## 2018-12-30 DIAGNOSIS — G43009 Migraine without aura, not intractable, without status migrainosus: Secondary | ICD-10-CM | POA: Insufficient documentation

## 2018-12-30 DIAGNOSIS — Z79899 Other long term (current) drug therapy: Secondary | ICD-10-CM | POA: Insufficient documentation

## 2018-12-30 DIAGNOSIS — Z6832 Body mass index (BMI) 32.0-32.9, adult: Secondary | ICD-10-CM

## 2018-12-30 DIAGNOSIS — T887XXA Unspecified adverse effect of drug or medicament, initial encounter: Secondary | ICD-10-CM | POA: Insufficient documentation

## 2018-12-30 MED ORDER — TOPIRAMATE 100 MG TABLET: Tab | ORAL | 11 refills | 0 days | Status: AC

## 2018-12-30 MED ORDER — TOPIRAMATE 25 MG TABLET: Tab | ORAL | 0 refills | 0 days | Status: AC

## 2018-12-30 NOTE — Progress Notes (Signed)
RE:  Min Tunnell          Aug 25, 1976    Dear [place] Alfredo Martinez, MD:    I had the pleasure of seeing Jasmine Brown in follow-up at the [facility] on 12/30/2018.  Today she reports that her headaches have not changed.  She is currently taking amitriptyline, magnesium and riboflavin for prevention and using zolmitriptan and hydroxyzine as needed for her headaches.  Severe Headaches: 2 per month, lasting up to 6 days  Response to abortive medications: Poor  Side effects to medications: lots of weight gain.  She was on topamax before and thinks it was helpful and she didn't gain weight then.    She tells me that she was diagnosed with a T5 hemangioma which has been seen by neurosurgery in [place] and deemed not to need surgery.  She has a lot of back pain and thinks this is the cause.    Results of previous testing: n/a    Current Outpatient Medications   Medication Sig Dispense Refill   . ALPRAZolam (XANAX) 1 mg Oral Tablet Take 1 mg by mouth Three times a day as needed for Insomnia (weaning off)      . amitriptyline (ELAVIL) 25 mg Oral Tablet 3 qhs 90 Tab 5   . amitriptyline (ELAVIL) 50 mg Oral Tablet TAKE 1 TABLET(50 MG) BY MOUTH EVERY NIGHT 30 Tab 5   . estradiol (ESTRACE) 0.01 % (0.1 mg/gram) Vaginal Cream 2 g by Vaginal route     . hydrOXYzine pamoate (VISTARIL) 25 mg Oral Capsule 1-2 q8h prn headache 30 Cap 5   . Lactobac no.41/Bifidobact no.7 (PROBIOTIC-10 ORAL) Take by mouth     . magnesium oxide 400 mg magnesium Oral Tablet Take 1 Tab (400 mg total) by mouth Once a day     . MULTIVITAMIN (MULTIPLE VITAMINS ORAL) Take by mouth Once a day     . norethindrone-ethinyl estradiol (EQUIV TO: ORTHO NOVUM 1-35) 1-35 mg-mcg/tab Oral Tablet Take 1 Tab by mouth Once a day     . predniSONE (DELTASONE) 20 mg Oral Tablet 3/d x 4d, then 2/d x 4d, then 1/d x 4d, then 1/2 x 4d 26 Tab 0   . riboflavin, vitamin B2, 400 mg Oral Tablet Take 1 Tab (400 mg total) by mouth Once a day     . spironolactone (ALDACTONE) 100 mg Oral  Tablet Take 100 mg by mouth Every morning with breakfast     . tiZANidine (ZANAFLEX) 4 mg Oral Tablet Take 4 mg by mouth Three times a day     . topiramate (TOPAMAX) 100 mg Oral Tablet 1 qhs x 2 weeks, then 2 qhs 60 Tab 11   . topiramate (TOPAMAX) 25 mg Oral Tablet 1 qhs x 7d, 2 qhs x 7d, 3 qhs x 7d 42 Tab 0   . Zolmitriptan (ZOMIG) 5 mg Oral Tablet Take at onset of headache, may repeat in 2 hours if needed.  Max 2/d, max 3d/wk 9 Tab 11     No current facility-administered medications for this visit.         Past medical history, family history, and social history have been reviewed and confirmed.    Sleep:good  Mood:Mixed    Vitals:    12/30/18 1329   BP: 134/86   Pulse: (!) 123   SpO2: 93%   Weight: 76.2 kg (167 lb 15.9 oz)   Height: 1.524 m (5')   BMI: 32.88  Her exam reveals she is normocephalic and atraumatic.  Mental status exam shows her to be oriented with good memory, attention, knowledge, and language.  Cranial nerve exam reveals equal and reactive pupils with intact conjugate eye movements, symmetric face, and clear speech, and normal hearing to voice.  Gait is steady and coordination is intact to finger-to-nose testing.  All four extremeties have normal strength.      ICD-10-CM    1. Migraine without aura G43.009 topiramate (TOPAMAX) 25 mg Oral Tablet     topiramate (TOPAMAX) 100 mg Oral Tablet   2. Medication side effects T88.7XXA       She will bring her mri films at her next visit, although I did tell her spinal issues were not my area of expertise.    She will taper off of elavil due to severe weight gain.  She will go back onto topamax for prevention, up to 200mg  nightly.  She will try nerivio as needed for migraine.    The patient will follow up with me in 6 months or sooner if needed.      Donovan Kail, MD 12/30/2018, 13:59    Zella Richer. [name] MD  Director, [facility]  Associate Professor of Neurology  [facility]

## 2018-12-30 NOTE — Telephone Encounter (Signed)
Regarding: [name]  ----- Message from Dorann Ou sent at 12/30/2018  3:18 PM EST -----  Pharmacy is calling regarding the dosing for the topiramate (TOPAMAX) 100 mg Oral Tablet     They have questions about the dosage increase       Please call pharmacy    Preferred [facility]    [place] [address]    Phone: [phone] Fax: [phone]    Not a 24 hour pharmacy; exact hours not known.

## 2018-12-30 NOTE — Nursing Note (Signed)
Called and verified information    Nolen Mu, RN  12/30/2018, 16:10

## 2019-03-10 ENCOUNTER — Telehealth (INDEPENDENT_AMBULATORY_CARE_PROVIDER_SITE_OTHER): Payer: Self-pay | Admitting: Specialist

## 2019-03-10 ENCOUNTER — Encounter (INDEPENDENT_AMBULATORY_CARE_PROVIDER_SITE_OTHER): Payer: Self-pay | Admitting: Specialist

## 2019-03-10 NOTE — Telephone Encounter (Signed)
----- Message from Alfonse Alpers, MA sent at 03/10/2019 10:52 AM EDT -----  Regarding: FW: Non-Urgent Medical Question  Contact: [phone]    ----- Message -----  From: Flossie Buffy  Sent: 03/10/2019  10:43 AM EDT  To: Neuro- Headache  Subject: Non-Urgent Medical Question                      First, thank you I am down 20 pounds, my migraines have decreased tremendously. However, I believe the amount of stress the last few weeks I am under are causing some of my body to honestly I don't know what the world it's doing but I'm not liking it! I'm dropping things, lots of things, my back, I'd rather give birth 5 times a week! The left side of my body, I've known for awhile something was up there, but I cannot go down a flight of steps if I have to use the left side of my body as the dominant side, I can not pass a vehicle, I just continue in the slow lane, my left side works but is slow. I knew something was up but I just kinda I guess with all the other stuff going on let it slide. The last couple weeks. Has been extremely stressful on my body. I just now got a few days off from work and was working 10 hour days. I am working from home, I'd rather work at the office! And all I could do was sleep! It's like my body's off switch turned off! I ran out of the fluid pull my gyn put me on my kid went and picked it up for me. Because of my 10 hour days and the pharmacy he called it into I'd go and they were closed! I bent over in the shower completely lost my balance, got dizzy and I just went ahead grabbed the bottom and took myself on down. I've learned when I'm going down it's easier on my body if I use what little control I have to soften the fall than to take anymore blows. I'm getting a little concerned. I am back to the medication that I was on before with an exception of the fluid pill and birth control which obviously I'm thinking I need this fluid pill. Btw without it my fingers look like Vienna sausages. And the  birth control is to get my hormones back in line because of the lesions that's we're found. Anyway, I'm getting concerned, because there's issues here, whatever this is with my left side, scares me, heights! I've never been afraid of heights! I use to go 500 feet in the air on a sign with my daddy secured by a rope tied to his belt loop. I tried to climb a step stool to clean a window, my husband had to physically remove because I was crying. My body goes so long, then my body just stops, even with sleep. My physical appearance, I have no color left, I'm tired, even after sleep, I'm taking my vitamins, I'm eating healthy, I'm not really going anywhere we're under quarantine. But I'm becoming concerned and I wanted you to be aware of these things. I don't think they are normal. Oh and if I have to move forward up the steps, etc with my left side it resembles a horse stomping and I have to stop, and think and tell myself what I'm doing. It's like I have to tell my mind what to do. This makes no  sense.     I know my problems are small compared to the world, also I think I have a bill to pay. Please have someone from billing call so I can pay!     Thank you   Jasmine Brown!

## 2019-03-10 NOTE — Telephone Encounter (Signed)
-----   Message from [place] sent at 03/10/2019 12:39 PM EDT -----  Regarding: Non-Urgent Medical Question  Contact: [phone]  One more thing, I'm totally down today, had to take a day from work. Could not get myself in the tub. My body is just working against me. I'm at a loss here. All I know to do is let it recharge!

## 2019-03-12 ENCOUNTER — Other Ambulatory Visit: Payer: Self-pay

## 2019-03-12 ENCOUNTER — Telehealth: Payer: BC Managed Care – PPO | Admitting: Specialist

## 2019-03-12 ENCOUNTER — Encounter (INDEPENDENT_AMBULATORY_CARE_PROVIDER_SITE_OTHER): Payer: Self-pay | Admitting: Specialist

## 2019-03-12 DIAGNOSIS — G43009 Migraine without aura, not intractable, without status migrainosus: Secondary | ICD-10-CM

## 2019-03-12 DIAGNOSIS — G43109 Migraine with aura, not intractable, without status migrainosus: Secondary | ICD-10-CM

## 2019-03-12 MED ORDER — TOPIRAMATE 100 MG TABLET
ORAL_TABLET | ORAL | 11 refills | Status: DC
Start: 2019-03-12 — End: 2020-03-25

## 2019-03-12 NOTE — Progress Notes (Signed)
TELEMEDICINE DOCUMENTATION:    Patient Location:  MyChart video visit from her work in [employer]    Patient/family aware of provider location:  yes  Patient/family consent for telemedicine:  yes  Examination observed and performed by:  Donovan Kail, MD       RE:  Jasmine Brown          1976/06/06    Dear Melchor Alfredo Martinez, MD:    I had the pleasure of seeing (via video) Jasmine Brown in follow-up at the [facility] on 03/12/2019.  Her left arm and her right leg have been having problems.  She has developed numbness in her left arm from her elbow down and she has been dropping things.  She also had weakness in her right leg (she was walking "like a gimp").  This lasted for 36 hours and she slept a lot and when she woke up it was better.  This can happen when she has a migraine attack, but this time she didn't get a headache.  Today she reports that her headaches have improved.  She is currently taking topiramate for prevention and using nerivio as needed for her headaches.  She has tried nerivio and taken it up to a setting 96 (this is very high) and it helps her headache.  Severe Headaches: 1 per month  This recent episode of arm and leg symptoms was the first time she has had it without a headache.  She still feels exhausted and wiped out.  Results of previous testing: n/a    Current Outpatient Medications   Medication Sig Dispense Refill   . ALPRAZolam (XANAX) 1 mg Oral Tablet Take 1 mg by mouth Three times a day as needed for Insomnia (weaning off)      . estradiol (ESTRACE) 0.01 % (0.1 mg/gram) Vaginal Cream 2 g by Vaginal route     . Lactobac no.41/Bifidobact no.7 (PROBIOTIC-10 ORAL) Take by mouth     . magnesium oxide 400 mg magnesium Oral Tablet Take 1 Tab (400 mg total) by mouth Once a day     . MULTIVITAMIN (MULTIPLE VITAMINS ORAL) Take by mouth Once a day     . norethindrone-ethinyl estradiol (EQUIV TO: ORTHO NOVUM 1-35) 1-35 mg-mcg/tab Oral Tablet Take 1 Tab by mouth Once a day     . riboflavin, vitamin  B2, 400 mg Oral Tablet Take 1 Tab (400 mg total) by mouth Once a day     . spironolactone (ALDACTONE) 100 mg Oral Tablet Take 100 mg by mouth Every morning with breakfast     . tiZANidine (ZANAFLEX) 4 mg Oral Tablet Take 4 mg by mouth Three times a day     . topiramate (TOPAMAX) 100 mg Oral Tablet 3 qhs 90 Tab 11     No current facility-administered medications for this visit.         Past medical history, family history, and social history have been reviewed and confirmed.    Sleep:ok  Mood:Euthymic    Her exam reveals she is normocephalic and atraumatic.  Mental status exam shows her to be oriented with good memory, attention, knowledge, and language.  Cranial nerve exam reveals equal and reactive pupils with intact conjugate eye movements, symmetric face, and clear speech, and normal hearing to voice.  Gait is steady and coordination is intact to finger-to-nose testing.  All four extremeties have normal strength.      ICD-10-CM    1. Migraine with aura G43.109 topiramate (TOPAMAX) 100 mg Oral Tablet  2. Migraine without aura G43.009 topiramate (TOPAMAX) 100 mg Oral Tablet      She has done well with topamax from a headache standpoint, but the persistence of aura symptoms is likely still an element of her migraine disease which just hasn't fully resolved.  I will have her increase topamax to 300 mg.  If this is not tolerated, will try 300 mg daily of CoQ10.  Consider MRI if she persists in having aura without headache.    The patient will follow up with me in 6 months or sooner if needed.    Donovan Kail, MD 03/12/2019, 13:12    Zella Richer. [name] MD  Director, [facility]  Associate Professor of Neurology  [facility]

## 2019-03-29 ENCOUNTER — Encounter (INDEPENDENT_AMBULATORY_CARE_PROVIDER_SITE_OTHER): Payer: Self-pay | Admitting: Specialist

## 2019-04-01 NOTE — Telephone Encounter (Signed)
-----   Message from Steele Sizer, RN sent at 03/31/2019 12:53 PM EDT -----  Regarding: FW: [name]  ----- Message from Dorann Ou sent at 03/31/2019 12:40 PM EDT -----  Janaysha sent a MyChart message to Dr [name] over the weekend that contained a photo of her back     She would like Dr [name] to review the photos and give her a call asap    She is very concerned about the appearance of her back  Please call Vallarie at [phone]

## 2019-05-27 ENCOUNTER — Encounter (INDEPENDENT_AMBULATORY_CARE_PROVIDER_SITE_OTHER): Payer: Self-pay | Admitting: Specialist

## 2019-07-07 ENCOUNTER — Ambulatory Visit: Payer: BC Managed Care – PPO | Attending: Specialist | Admitting: Specialist

## 2019-07-07 ENCOUNTER — Encounter (INDEPENDENT_AMBULATORY_CARE_PROVIDER_SITE_OTHER): Payer: Self-pay | Admitting: Specialist

## 2019-07-07 ENCOUNTER — Other Ambulatory Visit: Payer: Self-pay

## 2019-07-07 VITALS — BP 98/64 | HR 101 | Ht 59.0 in | Wt 143.1 lb

## 2019-07-07 DIAGNOSIS — R569 Unspecified convulsions: Secondary | ICD-10-CM

## 2019-07-07 DIAGNOSIS — G43909 Migraine, unspecified, not intractable, without status migrainosus: Secondary | ICD-10-CM | POA: Insufficient documentation

## 2019-07-07 DIAGNOSIS — R51 Headache: Secondary | ICD-10-CM

## 2019-07-07 DIAGNOSIS — R519 Headache, unspecified: Secondary | ICD-10-CM

## 2019-07-07 DIAGNOSIS — Z79899 Other long term (current) drug therapy: Secondary | ICD-10-CM | POA: Insufficient documentation

## 2019-07-07 NOTE — Progress Notes (Signed)
RE:  Knox Holdman          1976-07-09    Dear Melchor Alfredo Martinez, MD:    I had the pleasure of seeing Jasmine Brown in follow-up at the [facility] on 07/07/2019.  Today she reports that her headaches have improved. States that the associated symptoms are still ongoing. Continues to have left sided weakness, numbness, trouble with initiating movement. Now described as constant symptoms. 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 Occurring in clusters, has residual brain fog afterwards with mixing up of her words. Can last up to 2-3 days. This is happening once per week. Not associated with her headaches. No longer driving. She is currently taking topiramate for prevention and using Nerivio (setting at 82). as needed for her headaches.  Severe Headaches: 2 per month  Total Headaches: 2 per month  Headache Free Days: 28 per month  Headache Description: squeezing pain, global.  Rated as 3/10.  Response to abortive medications: Good  Side effects to medications: none    Results of previous testing: none    Current Outpatient Medications   Medication Sig Dispense Refill   . ALPRAZolam (XANAX) 1 mg Oral Tablet Take 1 mg by mouth Three times a day      . cyanocobalamin, vitamin B-12, (VITAMIN B12 ORAL) Take by mouth     . estradiol (ESTRACE) 0.01 % (0.1 mg/gram) Vaginal Cream 2 g by Vaginal route     . Lactobac no.41/Bifidobact no.7 (PROBIOTIC-10 ORAL) Take by mouth     . magnesium oxide 400 mg magnesium Oral Tablet Take 1 Tab (400 mg total) by mouth Once a day     . MULTIVITAMIN (MULTIPLE VITAMINS ORAL) Take by mouth Once a day     . norethindrone-ethinyl estradiol (EQUIV TO: ORTHO NOVUM 1-35) 1-35 mg-mcg/tab Oral Tablet Take 1 Tab by mouth Once a day     . riboflavin, vitamin B2, 400 mg Oral Tablet Take 1 Tab (400 mg total) by mouth Once a day     . spironolactone  (ALDACTONE) 100 mg Oral Tablet Take 100 mg by mouth Every morning with breakfast     . tiZANidine (ZANAFLEX) 4 mg Oral Tablet Take 4 mg by mouth Three times a day     . topiramate (TOPAMAX) 100 mg Oral Tablet 3 qhs 90 Tab 11     No current facility-administered medications for this visit.         Past medical history, family history, and social history have been reviewed and confirmed.    Sleep: none  Mood:Euthymic    Vitals:    07/07/19 1542   BP: 98/64   Pulse: (!) 101   Weight: 64.9 kg (143 lb 1.3 oz)   Height: 1.499 m (4\' 11" )   BMI: 28.96         Her exam reveals she is normocephalic and atraumatic.  Mental status exam shows her to be oriented with good memory, attention, knowledge, and language.  Cranial nerve exam reveals equal and reactive pupils with intact conjugate eye movements, symmetric face, and clear speech, and normal hearing to voice.  Gait is steady and coordination is intact to finger-to-nose testing.  All four extremeties have normal strength.      ICD-10-CM    1. Headache R51         She  has done well with topamax from a headache standpoint, but the persistence of aura symptoms is likely still an element of her migraine disease which just hasn't fully resolved.    Continue Topamax 300 mg nightly  Continue Nerivio  If this is not tolerated, will try 300 mg daily of CoQ10.      Discussed EMU admission for long term EEG monitoring with spell capture  Once admitted, discussed consulting neurosurgery to evaluate T spine hemangioma  Will obtain MRI brain and C/T spine at that time to work up for L side weakness/numbness as well.    The patient will follow up with me in 6 months or sooner if needed.    Total face-to-face time by staff:   minutes. Greater than 50% of that time was spent on counseling/coordination of care regarding:         Jaynee Eagles, MD 07/07/2019, 16:16     I saw and examined the patient.  I reviewed the resident's note.  I agree with the findings and plan of care as documented in  the resident's note.  Any exceptions/additions are edited/noted.    Donovan Kail, MD

## 2019-07-15 ENCOUNTER — Encounter (INDEPENDENT_AMBULATORY_CARE_PROVIDER_SITE_OTHER): Payer: Self-pay | Admitting: Specialist

## 2019-07-15 MED ORDER — TIZANIDINE 4 MG TABLET
ORAL_TABLET | ORAL | 0 refills | Status: DC
Start: 2019-07-15 — End: 2019-09-18

## 2019-07-15 NOTE — Telephone Encounter (Signed)
-----   Message from Steele Sizer, RN sent at 07/15/2019  9:09 AM EDT -----  Regarding: FW: Prescription Question  Contact: [phone]  Do you want to write fo this?  ----- Message -----  From: Jasmine Brown  Sent: 07/15/2019   9:07 AM EDT  To: Neuro- Headache  Subject: Prescription Question                            I have set up an appointment with a new PCP from the list of providers given to me from [facility]. They set me up September 10,2020 at 8:45 am. I come in for my study at [facility] August 15, 2019. Here's my problem. I have enough of all my medication except the tizanidine 4mg . I only 1 of these a day. I take a 1/2 at 10:30 am and a 1/2 at 7:30 pm. I have exactly 1 and a 1/2 left. I don't know what to do. When I stop taking them my seizures increase, I don't know if it's just a matter of keeping my body relaxed or what it is but it's better than having multiple seizures a day. I don't think my gynecologist can write these and you are the only other doctor other than my dentist I have right now. I'm trying so hard and I'm just lost. I use to only take these when my pain was so bad I couldn't handle it my doctor said it would help with my spinal issues. Umm that was a negative, but when I tried it after my seizures because my body hurt so bad cause it felt like I'd been beaten the seizures would start to get further apart. I had one yesterday and I just want them to stop. If you can't you can't. But I had to ask I have no other options. My pharmacy is [facility] you can see this is not something I ask for often.

## 2019-07-28 ENCOUNTER — Encounter (INDEPENDENT_AMBULATORY_CARE_PROVIDER_SITE_OTHER): Payer: Self-pay | Admitting: Specialist

## 2019-07-29 ENCOUNTER — Encounter (INDEPENDENT_AMBULATORY_CARE_PROVIDER_SITE_OTHER): Payer: Self-pay | Admitting: Specialist

## 2019-08-15 ENCOUNTER — Inpatient Hospital Stay (HOSPITAL_COMMUNITY): Payer: BC Managed Care – PPO | Admitting: Neurology

## 2019-08-15 ENCOUNTER — Encounter (HOSPITAL_COMMUNITY): Payer: Self-pay | Admitting: Neurology

## 2019-08-15 ENCOUNTER — Other Ambulatory Visit: Payer: Self-pay

## 2019-08-15 ENCOUNTER — Inpatient Hospital Stay
Admission: RE | Admit: 2019-08-15 | Discharge: 2019-08-17 | DRG: 880 | Disposition: A | Discharge status: Home / Self Care | Payer: BC Managed Care – PPO | Source: Ambulatory Visit | Attending: Neurology | Admitting: Neurology

## 2019-08-15 DIAGNOSIS — F1320 Sedative, hypnotic or anxiolytic dependence, uncomplicated: Secondary | ICD-10-CM | POA: Diagnosis present

## 2019-08-15 DIAGNOSIS — Z79899 Other long term (current) drug therapy: Secondary | ICD-10-CM

## 2019-08-15 DIAGNOSIS — G43909 Migraine, unspecified, not intractable, without status migrainosus: Secondary | ICD-10-CM | POA: Diagnosis present

## 2019-08-15 DIAGNOSIS — F17200 Nicotine dependence, unspecified, uncomplicated: Secondary | ICD-10-CM

## 2019-08-15 DIAGNOSIS — B0229 Other postherpetic nervous system involvement: Secondary | ICD-10-CM | POA: Diagnosis present

## 2019-08-15 DIAGNOSIS — Z8673 Personal history of transient ischemic attack (TIA), and cerebral infarction without residual deficits: Secondary | ICD-10-CM

## 2019-08-15 DIAGNOSIS — Z62810 Personal history of physical and sexual abuse in childhood: Secondary | ICD-10-CM | POA: Diagnosis present

## 2019-08-15 DIAGNOSIS — F17210 Nicotine dependence, cigarettes, uncomplicated: Secondary | ICD-10-CM | POA: Diagnosis present

## 2019-08-15 DIAGNOSIS — F445 Conversion disorder with seizures or convulsions: Principal | ICD-10-CM | POA: Diagnosis present

## 2019-08-15 DIAGNOSIS — R569 Unspecified convulsions: Secondary | ICD-10-CM | POA: Diagnosis present

## 2019-08-15 DIAGNOSIS — Z793 Long term (current) use of hormonal contraceptives: Secondary | ICD-10-CM

## 2019-08-15 LAB — CBC WITH DIFF
BASOPHIL #: <0.1 10*3/uL (ref <=0.20)
BASOPHIL %: 1 %
EOSINOPHIL #: 0.32 10*3/uL (ref <=0.50)
EOSINOPHIL %: 2 %
HCT: 43.5 % (ref 34.8–46.0)
HGB: 15.1 g/dL (ref 11.5–16.0)
IMMATURE GRANULOCYTE #: <0.1 10*3/uL (ref <0.10)
IMMATURE GRANULOCYTE %: 0 % (ref 0–1)
LYMPHOCYTE #: 4.83 10*3/uL — ABNORMAL HIGH (ref 1.00–4.80)
LYMPHOCYTE %: 34 %
MCH: 33.9 pg — ABNORMAL HIGH (ref 26.0–32.0)
MCHC: 34.7 g/dL (ref 31.0–35.5)
MCV: 97.5 fL (ref 78.0–100.0)
MONOCYTE #: 1.17 10*3/uL — ABNORMAL HIGH (ref 0.20–1.10)
MONOCYTE %: 8 %
MPV: 10.1 fL (ref 8.7–12.5)
NEUTROPHIL #: 7.93 10*3/uL — ABNORMAL HIGH (ref 1.50–7.70)
NEUTROPHIL %: 55 %
PLATELETS: 248 10*3/uL (ref 150–400)
RBC: 4.46 10*6/uL (ref 3.85–5.22)
RDW-CV: 13.1 % (ref 11.5–15.5)
WBC: 14.4 10*3/uL — ABNORMAL HIGH (ref 3.7–11.0)

## 2019-08-15 LAB — BASIC METABOLIC PANEL
ANION GAP: 9 mmol/L (ref 4–13)
BUN/CREA RATIO: 12 (ref 6–22)
BUN: 10 mg/dL (ref 8–25)
CALCIUM: 8.7 mg/dL (ref 8.5–10.2)
CHLORIDE: 109 mmol/L (ref 96–111)
CO2 TOTAL: 19 mmol/L — ABNORMAL LOW (ref 22–32)
CREATININE: 0.83 mg/dL (ref 0.49–1.10)
ESTIMATED GFR: >60 mL/min/{1.73_m2} (ref >60)
GLUCOSE: 83 mg/dL (ref 65–139)
POTASSIUM: 3.6 mmol/L (ref 3.5–5.1)
SODIUM: 137 mmol/L (ref 136–145)

## 2019-08-15 LAB — PHOSPHORUS: PHOSPHORUS: 3.2 mg/dL (ref 2.4–4.7)

## 2019-08-15 LAB — MAGNESIUM: MAGNESIUM: 2.1 mg/dL (ref 1.6–2.6)

## 2019-08-15 MED ORDER — CYANOCOBALAMIN (VIT B-12) 1,000 MCG TABLET
1000.00 ug | ORAL_TABLET | Freq: Every day | ORAL | Status: DC
Start: 2019-08-16 — End: 2019-08-17
  Administered 2019-08-16 – 2019-08-17 (×2): 1000 ug via ORAL
  Filled 2019-08-15: qty 1

## 2019-08-15 MED ORDER — LORAZEPAM 2 MG/ML INJECTION SOLUTION
2.0000 mg | Freq: Once | INTRAMUSCULAR | Status: DC | PRN
Start: 2019-08-15 — End: 2019-08-17

## 2019-08-15 MED ORDER — TOPIRAMATE 25 MG TABLET
50.0000 mg | ORAL_TABLET | Freq: Once | ORAL | Status: AC
Start: 2019-08-16 — End: 2019-08-16
  Administered 2019-08-16: 09:00:00 50 mg via ORAL
  Filled 2019-08-15: qty 2

## 2019-08-15 MED ORDER — MAGNESIUM OXIDE 400 MG (241.3 MG MAGNESIUM) TABLET
400.00 mg | ORAL_TABLET | Freq: Every evening | ORAL | Status: DC
Start: 2019-08-15 — End: 2019-08-17
  Administered 2019-08-15 – 2019-08-16 (×2): 400 mg via ORAL
  Filled 2019-08-15 (×2): qty 1

## 2019-08-15 MED ORDER — ATORVASTATIN 10 MG TABLET
10.00 mg | ORAL_TABLET | Freq: Every evening | ORAL | Status: DC
Start: 2019-08-15 — End: 2019-08-17
  Administered 2019-08-15 – 2019-08-16 (×2): 10 mg via ORAL
  Filled 2019-08-15 (×2): qty 1

## 2019-08-15 MED ORDER — DIPHENHYDRAMINE 25 MG CAPSULE
25.0000 mg | ORAL_CAPSULE | ORAL | Status: DC | PRN
Start: 2019-08-15 — End: 2019-08-17

## 2019-08-15 MED ORDER — ENOXAPARIN 40 MG/0.4 ML SUBCUTANEOUS SYRINGE
40.0000 mg | INJECTION | SUBCUTANEOUS | Status: DC
Start: 2019-08-15 — End: 2019-08-17
  Administered 2019-08-15 – 2019-08-16 (×2): 40 mg via SUBCUTANEOUS
  Administered 2019-08-17: 15:00:00
  Filled 2019-08-15 (×2): qty 0.4

## 2019-08-15 MED ORDER — LORAZEPAM 2 MG/ML INJECTION SOLUTION
4.0000 mg | INTRAMUSCULAR | Status: DC | PRN
Start: 2019-08-15 — End: 2019-08-17

## 2019-08-15 MED ORDER — ACETAMINOPHEN 325 MG TABLET
650.0000 mg | ORAL_TABLET | ORAL | Status: DC | PRN
Start: 2019-08-15 — End: 2019-08-17
  Administered 2019-08-16: 650 mg via ORAL
  Filled 2019-08-15: qty 2

## 2019-08-15 MED ORDER — TOPIRAMATE 25 MG TABLET
150.0000 mg | ORAL_TABLET | Freq: Once | ORAL | Status: AC
Start: 2019-08-15 — End: 2019-08-15
  Administered 2019-08-15: 21:00:00 150 mg via ORAL
  Filled 2019-08-15 (×3): qty 1

## 2019-08-15 MED ORDER — ALPRAZOLAM 0.5 MG TABLET
1.00 mg | ORAL_TABLET | Freq: Three times a day (TID) | ORAL | Status: DC
Start: 2019-08-15 — End: 2019-08-17
  Administered 2019-08-15 – 2019-08-17 (×7): 1 mg via ORAL
  Filled 2019-08-15 (×7): qty 2

## 2019-08-15 MED ORDER — MULTIVITAMIN TABLET
1.0000 | ORAL_TABLET | Freq: Every day | ORAL | Status: DC
Start: 2019-08-15 — End: 2019-08-17
  Administered 2019-08-15 – 2019-08-17 (×3): 1 via ORAL
  Filled 2019-08-15 (×3): qty 1

## 2019-08-15 MED ORDER — NICOTINE 14 MG/24 HR DAILY TRANSDERMAL PATCH
14.0000 mg | MEDICATED_PATCH | Freq: Every day | TRANSDERMAL | Status: DC
Start: 2019-08-15 — End: 2019-08-17
  Administered 2019-08-15 – 2019-08-16 (×2): 14 mg via TRANSDERMAL
  Administered 2019-08-16: 09:00:00 via TRANSDERMAL
  Administered 2019-08-17: 10:00:00 14 mg via TRANSDERMAL
  Administered 2019-08-17: 10:00:00 via TRANSDERMAL
  Administered 2019-08-17: 17:00:00
  Filled 2019-08-15 (×3): qty 1

## 2019-08-15 MED ORDER — SPIRONOLACTONE 100 MG TABLET
100.00 mg | ORAL_TABLET | ORAL | Status: DC
Start: 2019-08-18 — End: 2019-08-17

## 2019-08-15 MED ORDER — RIBOFLAVIN (VITAMIN B2) 100 MG TABLET
400.0000 mg | ORAL_TABLET | Freq: Every day | ORAL | Status: DC
Start: 2019-08-15 — End: 2019-08-17
  Administered 2019-08-15: 0 mg via ORAL
  Administered 2019-08-16 – 2019-08-17 (×2): 400 mg via ORAL
  Filled 2019-08-15 (×3): qty 4

## 2019-08-15 MED ORDER — TOPIRAMATE 100 MG TABLET
300.00 mg | ORAL_TABLET | Freq: Every evening | ORAL | Status: DC
Start: 2019-08-15 — End: 2019-08-15

## 2019-08-15 MED ORDER — SENNOSIDES 8.6 MG-DOCUSATE SODIUM 50 MG TABLET
1.0000 | ORAL_TABLET | Freq: Two times a day (BID) | ORAL | Status: DC | PRN
Start: 2019-08-15 — End: 2019-08-17
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
## 2019-08-16 ENCOUNTER — Inpatient Hospital Stay (INDEPENDENT_AMBULATORY_CARE_PROVIDER_SITE_OTHER): Payer: BC Managed Care – PPO

## 2019-08-16 ENCOUNTER — Inpatient Hospital Stay (INDEPENDENT_AMBULATORY_CARE_PROVIDER_SITE_OTHER): Payer: BC Managed Care – PPO | Admitting: Radiology

## 2019-08-16 NOTE — Nurses Notes (Signed)
Pt observed for seizure like activity at 0833.  Pt sitting up in bed, bilat arms contracted to chest, L index finger twitching and pointing  toward the right. Mouthing movements noted.  Bilat eye twitching.  Pt unresponsive for 5 minutes.  When pt became responsive, she was quickly A+Ox3, arms continued to be contracted and pt stated, "Help me" to her spouse, who proceeded to rub her L arm and hand as well as her L leg which she said were stiff.  Pt responded, "I wish my brain would remember how to work."

## 2019-08-16 NOTE — Nurses Notes (Signed)
Pt reports continued diplopia when looking to the Left, and single vision when looking to the Right.  Pt also reports that when holding her phone in the Left hand, her arm begins to tremor and she is unable to hold onto her phone.  Neurology 1 MD has been made aware and is assessing pt at the bedside at this time.

## 2019-08-16 NOTE — Nurses Notes (Signed)
1068-Cremeans- pt push button event lasting 56mins. Pt reports being hot, and itchy, followed by left arm contraction, L eye deviation, and BLE stiffening. Follows commands throughout, able to recall 1/3 words. Vitals stable. Thanks [phone]

## 2019-08-16 NOTE — Care Plan (Signed)
Pt AOX4. VSS. OOB Ax1. No events overnight. Prn tylenol for HA with relief. Refuses ss, edu of fall risk, continues to refuse. Family to remain at bedside. Seizure precautions in place.     Hulen Luster, RN  08/16/2019, 03:49        Problem: Adult Inpatient Plan of Care  Goal: Plan of Care Review  Outcome: Ongoing (see interventions/notes)  Goal: Patient-Specific Goal (Individualized)  Outcome: Ongoing (see interventions/notes)  Goal: Absence of Hospital-Acquired Illness or Injury  Outcome: Ongoing (see interventions/notes)  Goal: Optimal Comfort and Wellbeing  Outcome: Ongoing (see interventions/notes)  Goal: Rounds/Family Conference  Outcome: Ongoing (see interventions/notes)     Problem: Seizure, Active Management  Goal: Absence of Seizure/Seizure-Related Injury  Outcome: Ongoing (see interventions/notes)     Problem: Fall Injury Risk  Goal: Absence of Fall and Fall-Related Injury  Outcome: Ongoing (see interventions/notes)     Problem: Fall Injury Risk  Goal: Absence of Fall and Fall-Related Injury  Outcome: Ongoing (see interventions/notes)

## 2019-08-16 NOTE — Progress Notes (Signed)
[facility]  Neurology Progress Note      Jasmine Brown, Jasmine Brown, 43 y.o. female  Date of Admission:  08/15/2019  Date of service: 08/16/2019  Date of Birth:  09-18-1976      Chief Complaint: seizure like activity   Pt's condition today: stable    Subjective: She did have one push button event yesterday, where she had eye fluttering but it didn't progress to her full blown seizure. She denies any other issues. She says she has intermittent facial droop on the left and she says it happens because of the leads.     Vital Signs:  Temp (24hrs) Max:36.4 C (99991111 F)      Systolic (123XX123), 123XX123 , Min:96 , 123XX123     Diastolic (123XX123), 0000000, Min:65, Max:74    Temp  Avg: 36.3 C (97.3 F)  Min: 36.2 C (97.1 F)  Max: 36.4 C (97.5 F)  Pulse  Avg: 83.7  Min: 74  Max: 96  Resp  Avg: 16.3  Min: 16  Max: 17  SpO2  Avg: 98.5 %  Min: 98 %  Max: 99 %  Pain Score (Numeric, Faces): 8    Today's Physical Exam:  General:alert  Mental status:Alert and oriented x 3  Memory:  Following of commands is normal  Attention: Attention and Concentration are normal  Knowledge: Good  Language and Speech: Normal and sounded slurred this AM  Cranial nerves: Cranial nerves 2-12 are normal except: left facial droop   Muscle tone: WNL  Motor strength:  Motor strength is normal throughout.  Sensory: Sensory exam in the upper and lower extremities is normal to light touch  Gait: Normal  Coordination: Coordination is normal without tremor  Reflexes: Reflexes are 2/2 throughout    Current Medications:  acetaminophen (TYLENOL) tablet, 650 mg, Oral, Q4H PRN  ALPRAZolam (XANAX) tablet, 1 mg, Oral, 3x/day  atorvastatin (LIPITOR) tablet, 10 mg, Oral, QPM  cyanocobalamin (VITAMIN B12) tablet, 1,000 mcg, Oral, Daily with Lunch  diphenhydrAMINE (BENADRYL) capsule, 25 mg, Oral, Q4H PRN  enoxaparin PF (LOVENOX) 40 mg/0.4 mL SubQ injection, 40 mg, Subcutaneous, Q24H  LORazepam (ATIVAN) 2 mg/mL injection, 2 mg, Intravenous, Once PRN  LORazepam (ATIVAN) 2 mg/mL  injection, 2 mg, Intravenous, Once PRN  LORazepam (ATIVAN) 2 mg/mL injection, 4 mg, Intravenous, Q5 Min PRN  magnesium oxide (MAG-OX) tablet, 400 mg, Oral, NIGHTLY  multivitamin tablet, 1 Tab, Oral, Daily  nicotine (NICODERM CQ) transdermal patch (mg/24 hr), 14 mg, Transdermal, Daily  riboflavin (VITAMIN B-2) tablet, 400 mg, Oral, Daily  sennosides-docusate sodium (SENOKOT-S) 8.6-50mg  per tablet, 1 Tab, Oral, 2x/day PRN  [START ON 08/18/2019] spironolactone (ALDACTONE) tablet, 100 mg, Oral, Once per day on Mon Tue Wed Thu  topiramate (TOPAMAX) tablet, 50 mg, Oral, Once        I/O:  I/O last 24 hours:      Intake/Output Summary (Last 24 hours) at 08/16/2019 0830  Last data filed at 08/16/2019 0600  Gross per 24 hour   Intake 364 ml   Output --   Net 364 ml     I/O current shift:  No intake/output data recorded.    Labs  Please indicate ordered or reviewed)  Reviewed:   Lab Results Today:    Results for orders placed or performed during the hospital encounter of 08/15/19 (from the past 24 hour(s))   BASIC METABOLIC PANEL   Result Value Ref Range    SODIUM 137 136 - 145 mmol/L    POTASSIUM 3.6 3.5 - 5.1  mmol/L    CHLORIDE 109 96 - 111 mmol/L    CO2 TOTAL 19 (L) 22 - 32 mmol/L    ANION GAP 9 4 - 13 mmol/L    CALCIUM 8.7 8.5 - 10.2 mg/dL    GLUCOSE 83 65 - 139 mg/dL    BUN 10 8 - 25 mg/dL    CREATININE 0.83 0.49 - 1.10 mg/dL    BUN/CREA RATIO 12 6 - 22    ESTIMATED GFR >60 >60 mL/min/1.50m^2   MAGNESIUM   Result Value Ref Range    MAGNESIUM 2.1 1.6 - 2.6 mg/dL   PHOSPHORUS   Result Value Ref Range    PHOSPHORUS 3.2 2.4 - 4.7 mg/dL   CBC WITH DIFF   Result Value Ref Range    WBC 14.4 (H) 3.7 - 11.0 x10^3/uL    RBC 4.46 3.85 - 5.22 x10^6/uL    HGB 15.1 11.5 - 16.0 g/dL    HCT 43.5 34.8 - 46.0 %    MCV 97.5 78.0 - 100.0 fL    MCH 33.9 (H) 26.0 - 32.0 pg    MCHC 34.7 31.0 - 35.5 g/dL    RDW-CV 13.1 11.5 - 15.5 %    PLATELETS 248 150 - 400 x10^3/uL    MPV 10.1 8.7 - 12.5 fL    NEUTROPHIL % 55 %    LYMPHOCYTE % 34 %    MONOCYTE %  8 %    EOSINOPHIL % 2 %    BASOPHIL % 1 %    NEUTROPHIL # 7.93 (H) 1.50 - 7.70 x10^3/uL    LYMPHOCYTE # 4.83 (H) 1.00 - 4.80 x10^3/uL    MONOCYTE # 1.17 (H) 0.20 - 1.10 x10^3/uL    EOSINOPHIL # 0.32 <=0.50 x10^3/uL    BASOPHIL # <0.10 <=0.20 x10^3/uL    IMMATURE GRANULOCYTE % 0 0 - 1 %    IMMATURE GRANULOCYTE # <0.10 <0.10 x10^3/uL       Review of reports and notes reveal:     Independent Interpretation of images or specimens:  No new imaging     Patient/ Family Discussion: updated patient on the plan    Assessment/Plan:  Active Hospital Problems    Diagnosis   . Seizure-like activity (CMS [facility])       Jasmine Brown is a 43 year old female with Hx of Thoracic hemangioma, ? stroke at the age of 89( had right sided weakness), migraines presents to [facility] for spell capture    - Admit to EMU for spell capture, reports spells every other day   - vEEG ordered. She had one spell today morning which was non epileptic. Will continue to monitor for more spells  - Topamax being weaned off, home dose 300mg  QHS. Given 150mg  9/18 PM and 50mg  in 9/19 AM and off topamax  - MRI brain, C and T spine ordered  - if >4 focal seizures then treat with ativan 1-2 mg  - if 1 GTC then treat with ativan 1-2 mg; if 2 or more GTC then treat with ativan 1-2 mg and load with keppra 500 mg      Labs/Diagnostic studies ordered: none    DVT/PE Prophylaxis: Enoxaparin    Elenor Legato, MD      I saw and examined the patient.  I reviewed the resident's note.  I agree with the findings and plan of care as documented in the resident's note.  Any exceptions/additions are edited/noted.    Janyce Llanos, MD

## 2019-08-16 NOTE — Nurses Notes (Signed)
1068-Thurlow- just letting you know pt doesn't have MRI compatible VEEG leads on and MRI will need deferred until removed. Thanks [phone]

## 2019-08-16 NOTE — Nurses Notes (Signed)
1068-Helderman- just FYI, pt took xanax at 0000 instead of scheduled 2200 per pt request [phone]. Thanks.

## 2019-08-17 ENCOUNTER — Inpatient Hospital Stay (HOSPITAL_COMMUNITY): Payer: BC Managed Care – PPO

## 2019-08-17 DIAGNOSIS — R202 Paresthesia of skin: Secondary | ICD-10-CM

## 2019-08-17 DIAGNOSIS — R531 Weakness: Secondary | ICD-10-CM

## 2019-08-17 DIAGNOSIS — F17200 Nicotine dependence, unspecified, uncomplicated: Secondary | ICD-10-CM

## 2019-08-17 DIAGNOSIS — D1809 Hemangioma of other sites: Secondary | ICD-10-CM

## 2019-08-17 MED ORDER — TOPIRAMATE 100 MG TABLET
300.0000 mg | ORAL_TABLET | Freq: Every evening | ORAL | Status: DC
Start: 2019-08-17 — End: 2019-08-17

## 2019-08-17 MED ORDER — GADOBUTROL 7.5 MMOL/7.5 ML (1 MMOL/ML) INTRAVENOUS SOLUTION
6.0000 mL | INTRAVENOUS | Status: AC
Start: 2019-08-17 — End: 2019-08-17
  Administered 2019-08-17: 15:00:00 6 mL via INTRAVENOUS

## 2019-08-17 NOTE — Progress Notes (Signed)
[facility]  Neurology Progress Note      ZOHRA, BIBB, 43 y.o. female  Date of Admission:  08/15/2019  Date of service: 08/17/2019  Date of Birth:  03/13/1976      Chief Complaint: seizure like activity   Pt's condition today: stable    Subjective: She had 2 events so far which are non epileptic and which are her typical spells. Denies any other issues overnight.     Vital Signs:  Temp (24hrs) Max:36.6 C (A999333 F)      Systolic (123XX123), 123456 , Min:87 , 99991111     Diastolic (123XX123), A999333, Min:57, Max:71    Temp  Avg: 36.3 C (97.4 F)  Min: 36 C (96.8 F)  Max: 36.6 C (97.9 F)  Pulse  Avg: 73  Min: 67  Max: 79  Resp  Avg: 16.7  Min: 16  Max: 17  SpO2  Avg: 97.7 %  Min: 97 %  Max: 98 %  Pain Score (Numeric, Faces): 0    Today's Physical Exam:  General:alert  Mental status:Alert and oriented x 3  Memory:  Following of commands is normal  Attention: Attention and Concentration are normal  Knowledge: Good  Language and Speech: Normal and normal  Cranial nerves: Cranial nerves 2-12 are normal left facial droop   Muscle tone: WNL  Motor strength:  Motor strength is normal throughout.  Sensory: Sensory exam in the upper and lower extremities is normal to light touch  Gait: Normal  Coordination: Coordination is normal without tremor  Reflexes: Reflexes are 2/2 throughout    Current Medications:  acetaminophen (TYLENOL) tablet, 650 mg, Oral, Q4H PRN  ALPRAZolam (XANAX) tablet, 1 mg, Oral, 3x/day  atorvastatin (LIPITOR) tablet, 10 mg, Oral, QPM  cyanocobalamin (VITAMIN B12) tablet, 1,000 mcg, Oral, Daily with Lunch  diphenhydrAMINE (BENADRYL) capsule, 25 mg, Oral, Q4H PRN  enoxaparin PF (LOVENOX) 40 mg/0.4 mL SubQ injection, 40 mg, Subcutaneous, Q24H  LORazepam (ATIVAN) 2 mg/mL injection, 2 mg, Intravenous, Once PRN  LORazepam (ATIVAN) 2 mg/mL injection, 2 mg, Intravenous, Once PRN  LORazepam (ATIVAN) 2 mg/mL injection, 4 mg, Intravenous, Q5 Min PRN  magnesium oxide (MAG-OX) tablet, 400 mg, Oral,  NIGHTLY  multivitamin tablet, 1 Tab, Oral, Daily  nicotine (NICODERM CQ) transdermal patch (mg/24 hr), 14 mg, Transdermal, Daily  riboflavin (VITAMIN B-2) tablet, 400 mg, Oral, Daily  sennosides-docusate sodium (SENOKOT-S) 8.6-50mg  per tablet, 1 Tab, Oral, 2x/day PRN  [START ON 08/18/2019] spironolactone (ALDACTONE) tablet, 100 mg, Oral, Once per day on Mon Tue Wed Thu        I/O:  I/O last 24 hours:      Intake/Output Summary (Last 24 hours) at 08/17/2019 0850  Last data filed at 08/16/2019 2000  Gross per 24 hour   Intake 2 ml   Output --   Net 2 ml     I/O current shift:  No intake/output data recorded.    Labs  Please indicate ordered or reviewed)  Reviewed:   Lab Results Today:    No results found for any visits on 08/15/19 (from the past 24 hour(s)).    Review of reports and notes reveal:     Independent Interpretation of images or specimens:  No new imaging     Patient/ Family Discussion: updated patient on the plan    Assessment/Plan:  Active Hospital Problems    Diagnosis   . Tobacco use disorder   . Seizure-like activity (CMS [facility])       Mrs. Tiyah Babka  is a 43 year old female with Hx of Thoracic hemangioma, ? stroke at the age of 45( had right sided weakness), migraines presents to [facility] for spell capture    - Admit to EMU for spell capture  - vEEG ordered. She had 2 spells so far which are non epileptic. Will continue to monitor for more spells  - Topamax being weaned off, home dose 300mg  QHS. Given 150mg  9/18 PM and 50mg  in 9/19 AM and off topamax  - MRI brain, C and T spine ordered  - if >4 focal seizures then treat with ativan 1-2 mg  - if 1 GTC then treat with ativan 1-2 mg; if 2 or more GTC then treat with ativan 1-2 mg and load with keppra 500 mg      Labs/Diagnostic studies ordered: none    DVT/PE Prophylaxis: Enoxaparin    Elenor Legato, MD  08/17/2019, 08:51      I saw and examined the patient.  I reviewed the resident's note.  I agree with the findings and plan of care as documented in the  resident's note.  Any exceptions/additions are edited/noted.    Janyce Llanos, MD

## 2019-08-17 NOTE — Care Plan (Signed)
Pt AOX4. VSS. Hypotensive but MAP WNL, retaken and within parameter. Event at 2235 (see note). Refuses ss, edu of fall risk, continues to refuse. Family to remain at bedside. Seizure precautions in place.     Hulen Luster, RN  08/17/2019, 03:41      Problem: Adult Inpatient Plan of Care  Goal: Plan of Care Review  Outcome: Ongoing (see interventions/notes)  Goal: Patient-Specific Goal (Individualized)  Outcome: Ongoing (see interventions/notes)  Goal: Absence of Hospital-Acquired Illness or Injury  Outcome: Ongoing (see interventions/notes)  Goal: Optimal Comfort and Wellbeing  Outcome: Ongoing (see interventions/notes)  Goal: Rounds/Family Conference  Outcome: Ongoing (see interventions/notes)     Problem: Seizure, Active Management  Goal: Absence of Seizure/Seizure-Related Injury  Outcome: Ongoing (see interventions/notes)     Problem: Fall Injury Risk  Goal: Absence of Fall and Fall-Related Injury  Outcome: Ongoing (see interventions/notes)     Problem: Fall Injury Risk  Goal: Absence of Fall and Fall-Related Injury  Outcome: Ongoing (see interventions/notes)
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
## 2019-08-17 NOTE — Care Plan (Signed)
Pt discharged to home with spouse via private vehicle.  Pt and spouse both verbalized an understanding of the diagnosis, f/u appt and no med changes.  Pt chose to ambulate off the unit, carrying her purse and another bag.  Her husband carried all other bags.  No personal belongings were left within the room at discharge.  Pt showed no s/s of pain or discomfort at time of leaving.  Pt was happy and laughing with spouse.

## 2019-08-17 NOTE — Procedures (Signed)
NAME:  Jasmine Brown NUMBER:  [id number]  DATE OF SERVICE:  08/15/2019  DOB:  14-Mar-1976  SEX:  F      Date of Study:  August 15, 2019.   Technician:  EC.   Status:  This is an inpatient study.   EEG #: [id number].    INTERPRETATION:  This is a normal awake and asleep EEG.    REPORT:  This is a digitally acquired video EEG done using the standard 10-20 system of electrode placement.  This EEG runs between 4:32 p.m. on August 15, 2019, until 4:32 p.m. on August 16, 2019.  This time period recorded the following.    CLINICAL EVENTS:  There are 2 pushbutton events, 1 for an episode during which the patient complains of head pain and another for an episode during which the patient has a sudden onset of asynchronous tremoring and unresponsiveness.  During both of these events, there is no change in the EEG to suggest any kind of electrographic seizure or epileptiform discharge.  Throughout the study, there is a normal background frequency of approximately 9 to 10 Hz over the posterior head regions.  Both sleep and wake states are recorded.    CLINICAL CORRELATION:  The EEG described above is within normal limits.  The events captured are nonepileptic in nature.        Janyce Llanos, MD  Assistant Professor   [place] Department of Neurology          DD:  08/17/2019 09:03:29  DT:  08/17/2019 15:10:26 DW  D#:  [id number]

## 2019-08-17 NOTE — Ancillary Notes (Signed)
[place]  MRI Technologist Note        MRI has been completed.        Haig Prophet, RTR 08/17/2019, 15:01

## 2019-08-18 NOTE — Procedures (Signed)
NAME:  Jasmine Brown NUMBER:  [id number]  DATE OF SERVICE:  08/16/2019  DOB:  11-17-1976  SEX:  F      Date of Study:  August 16, 2019.     Status:  This is an inpatient study.   Technician:  EC.   EEG #: [id number].    INTERPRETATION:  This is a normal awake and asleep EEG.    HISTORY:  This is a 43 year old female with a clinical concern for seizures.  This study was requested to evaluate for interictal abnormalities.    REPORT:  This is a digitally acquired video EEG done using the standard 10-20 system of electrode placement.  This EEG runs between 4:32 p.m. on August 16, 2019, until 12:21 p.m. on August 17, 2019.  This time period recorded the following.    CLINICAL EVENTS:  There was 1 pushbutton event initiated for an episode during which the patient has the abrupt onset of asynchronous shaking and unresponsiveness.  During this time, there is no change in the EEG to suggest any kind of electrographic seizure or epileptiform discharge.  Throughout the study, there is a normal background frequency of approximately 9-10 Hz over the posterior head regions.  Both sleep and wake states are recorded.    CLINICAL CORRELATION:  The EEG described above is within normal limits.  The spell captured is nonepileptic in nature.        Janyce Llanos, MD  Assistant Professor   [place] Department of Neurology          DD:  08/17/2019 15:51:05  DT:  08/18/2019 01:26:53 [place]  D#:  [id number]

## 2019-08-19 ENCOUNTER — Encounter (INDEPENDENT_AMBULATORY_CARE_PROVIDER_SITE_OTHER): Payer: Self-pay | Admitting: Specialist

## 2019-08-21 ENCOUNTER — Ambulatory Visit (INDEPENDENT_AMBULATORY_CARE_PROVIDER_SITE_OTHER): Payer: Self-pay | Admitting: Specialist

## 2019-08-21 NOTE — Telephone Encounter (Addendum)
Fwd to provider to call  Steele Sizer, RN  08/21/2019, 10:14      Regarding: [name]  ----- Message from Dorann Ou sent at 08/21/2019 10:03 AM EDT -----  Jasmine Brown would like to talk with Dr [name] about her MRI results       She has read the results on MyChart and is flipping out about them     The results are causing her a lot of anxiety    Please call Willie at [phone]

## 2019-08-22 ENCOUNTER — Telehealth (HOSPITAL_COMMUNITY): Payer: Self-pay

## 2019-08-22 NOTE — Telephone Encounter (Signed)
Patient has been placed on the therapy wait list.  Lottie Rater, CASE MANAGER  08/22/2019, 14:29

## 2019-08-25 ENCOUNTER — Encounter (INDEPENDENT_AMBULATORY_CARE_PROVIDER_SITE_OTHER): Payer: Self-pay | Admitting: Specialist

## 2019-08-27 ENCOUNTER — Ambulatory Visit (INDEPENDENT_AMBULATORY_CARE_PROVIDER_SITE_OTHER): Payer: Self-pay | Admitting: Specialist

## 2019-08-27 DIAGNOSIS — G43119 Migraine with aura, intractable, without status migrainosus: Secondary | ICD-10-CM

## 2019-08-27 DIAGNOSIS — R9089 Other abnormal findings on diagnostic imaging of central nervous system: Secondary | ICD-10-CM

## 2019-08-27 DIAGNOSIS — R569 Unspecified convulsions: Secondary | ICD-10-CM

## 2019-08-27 NOTE — Telephone Encounter (Addendum)
Can you please call?  Steele Sizer, RN  08/27/2019, 09:36      Regarding: [name]  ----- Message from [place] sent at 08/27/2019  8:34 AM EDT -----  Pt called and stated that she is very scared about her MRI results. Pt was crying on the phone and stated that she doesn't know what to do. She stated that she would like to talk to Dr. [name] and doesn't understand why nobody has gotten back with her. Pt stated that her speech has gotten very bad. 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 Please call to discuss. Thanks    ----- Message from Dorann Ou sent at 08/21/2019 10:03 AM EDT -----  [name] would like to talk with Dr [name] about her MRI results       She has read the results on MyChart and is flipping out about them     The results are causing her a lot of anxiety    Please call [name] at [phone]

## 2019-09-08 ENCOUNTER — Encounter (INDEPENDENT_AMBULATORY_CARE_PROVIDER_SITE_OTHER): Payer: Self-pay | Admitting: Specialist

## 2019-09-11 ENCOUNTER — Ambulatory Visit (INDEPENDENT_AMBULATORY_CARE_PROVIDER_SITE_OTHER): Payer: Self-pay | Admitting: Family

## 2019-09-11 NOTE — Telephone Encounter (Addendum)
Called patient. Speech was very slurred. I instructed her that due to these symptoms, she needed to get to the nearest ED ASAP, not necessarily [facility]. She stated that she needed to call someone to give her a ride, and I told her that if she didn't have anyone to pick her up within the next hour or so, she needed to call an ambulance. Patient agreed. Attempted to call PCP but they were out for lunch  Steele Sizer, RN  09/11/2019, 12:31      Regarding: Jasmine Brown  ----- Message from Sharee Holster sent at 09/11/2019 11:12 AM EDT -----  Patient's PCP called and is concerned because she has declined rapidly.  She has right side facial droop, abnormal gait, incontinence, and stuttering.  They are looking for consult/advise on what to do.  Does she need to come to [facility] ED or be seen by [place] sooner than 09/18/2019?  Please contact patient and Dr Para March in [place] [phone]).

## 2019-09-17 ENCOUNTER — Other Ambulatory Visit: Payer: Self-pay

## 2019-09-17 ENCOUNTER — Telehealth: Payer: Self-pay | Admitting: Student in an Organized Health Care Education/Training Program

## 2019-09-17 DIAGNOSIS — F445 Conversion disorder with seizures or convulsions: Secondary | ICD-10-CM

## 2019-09-17 NOTE — H&P (Signed)
Started this appointment as NPV.     Immediately noticed that patient has slurred speech. Patient stated this is not normal for her - started 3 weeks ago and has been worsening and patient has also been feeling more foggy and falling more.    Patient with h/o stroke.     Patient states she tried to reach out to her neurologist and was unable to do so but has an appointment with them tomorrow.     Per chart review, patient had been recommended to go to ED to assess her slurred speech.     This provider recommended immediate evaluation at nearest ED. This provider spoke with patient's husband - both agreeable for ED evaluation.     Patient to call back to reschedule intake visit.     No SI or HI.    Kathlene Cote, MD  09/17/2019, 16:45

## 2019-09-18 ENCOUNTER — Encounter (INDEPENDENT_AMBULATORY_CARE_PROVIDER_SITE_OTHER): Payer: Self-pay | Admitting: Family

## 2019-09-18 ENCOUNTER — Ambulatory Visit (HOSPITAL_BASED_OUTPATIENT_CLINIC_OR_DEPARTMENT_OTHER): Payer: BC Managed Care – PPO | Admitting: Student in an Organized Health Care Education/Training Program

## 2019-09-18 ENCOUNTER — Observation Stay
Admission: AD | Admit: 2019-09-18 | Discharge: 2019-09-20 | Disposition: A | Discharge status: Home / Self Care | Payer: BC Managed Care – PPO | Source: Ambulatory Visit | Attending: NEUROLOGY | Admitting: NEUROLOGY

## 2019-09-18 DIAGNOSIS — F17210 Nicotine dependence, cigarettes, uncomplicated: Secondary | ICD-10-CM | POA: Insufficient documentation

## 2019-09-18 DIAGNOSIS — Z8673 Personal history of transient ischemic attack (TIA), and cerebral infarction without residual deficits: Secondary | ICD-10-CM | POA: Insufficient documentation

## 2019-09-18 DIAGNOSIS — R29818 Other symptoms and signs involving the nervous system: Secondary | ICD-10-CM

## 2019-09-18 DIAGNOSIS — R251 Tremor, unspecified: Secondary | ICD-10-CM | POA: Insufficient documentation

## 2019-09-18 DIAGNOSIS — R4781 Slurred speech: Secondary | ICD-10-CM

## 2019-09-18 DIAGNOSIS — R32 Unspecified urinary incontinence: Secondary | ICD-10-CM | POA: Insufficient documentation

## 2019-09-18 DIAGNOSIS — Z79899 Other long term (current) drug therapy: Secondary | ICD-10-CM | POA: Insufficient documentation

## 2019-09-18 DIAGNOSIS — R2689 Other abnormalities of gait and mobility: Secondary | ICD-10-CM | POA: Insufficient documentation

## 2019-09-18 DIAGNOSIS — G43909 Migraine, unspecified, not intractable, without status migrainosus: Secondary | ICD-10-CM | POA: Insufficient documentation

## 2019-09-18 DIAGNOSIS — R9431 Abnormal electrocardiogram [ECG] [EKG]: Secondary | ICD-10-CM

## 2019-09-18 DIAGNOSIS — R4189 Other symptoms and signs involving cognitive functions and awareness: Principal | ICD-10-CM | POA: Insufficient documentation

## 2019-09-18 DIAGNOSIS — R29810 Facial weakness: Secondary | ICD-10-CM | POA: Insufficient documentation

## 2019-09-18 LAB — MANUAL DIFF AND MORPHOLOGY-SYSMEX
BASOPHIL #: 0.1 10*3/uL (ref <=0.20)
BASOPHIL %: 1 %
EOSINOPHIL #: 0.31 10*3/uL (ref <=0.50)
EOSINOPHIL %: 3 %
LYMPHOCYTE #: 5.97 10*3/uL — ABNORMAL HIGH (ref 1.00–4.80)
LYMPHOCYTE %: 58 %
MONOCYTE #: 0.62 10*3/uL (ref 0.20–1.10)
MONOCYTE %: 6 %
NEUTROPHIL #: 3.3 10*3/uL (ref 1.50–7.70)
NEUTROPHIL %: 32 %
RBC MORPHOLOGY: NORMAL

## 2019-09-18 LAB — ECG 12-LEAD
Atrial Rate: 76 {beats}/min
Calculated P Axis: 62 degrees
Calculated R Axis: 56 degrees
Calculated T Axis: 62 degrees
PR Interval: 116 ms
QRS Duration: 72 ms
QT Interval: 376 ms
QTC Calculation: 423 ms
Ventricular rate: 76 {beats}/min

## 2019-09-18 LAB — CBC WITH DIFF
HCT: 36.2 % (ref 34.8–46.0)
HGB: 12.8 g/dL (ref 11.5–16.0)
MCH: 34.3 pg — ABNORMAL HIGH (ref 26.0–32.0)
MCHC: 35.4 g/dL (ref 31.0–35.5)
MCV: 97.1 fL (ref 78.0–100.0)
MPV: 11.1 fL (ref 8.7–12.5)
PLATELETS: 203 10*3/uL (ref 150–400)
RBC: 3.73 10*6/uL — ABNORMAL LOW (ref 3.85–5.22)
RDW-CV: 13 % (ref 11.5–15.5)
WBC: 10.3 10*3/uL (ref 3.7–11.0)

## 2019-09-18 MED ORDER — HEPARIN (PORCINE) 5,000 UNIT/ML INJECTION SOLUTION
5000.0000 [IU] | Freq: Three times a day (TID) | INTRAMUSCULAR | Status: DC
Start: 2019-09-19 — End: 2019-09-20
  Administered 2019-09-19: 5000 [IU] via SUBCUTANEOUS
  Administered 2019-09-19: 22:00:00 0 [IU] via SUBCUTANEOUS
  Administered 2019-09-19 – 2019-09-20 (×2): 5000 [IU] via SUBCUTANEOUS
  Administered 2019-09-20: 14:00:00
  Filled 2019-09-18 (×3): qty 1

## 2019-09-18 MED ORDER — ONDANSETRON HCL (PF) 4 MG/2 ML INJECTION SOLUTION
4.0000 mg | Freq: Four times a day (QID) | INTRAMUSCULAR | Status: DC | PRN
Start: 2019-09-18 — End: 2019-09-20

## 2019-09-18 MED ORDER — SODIUM CHLORIDE 0.9 % (FLUSH) INJECTION SYRINGE
2.0000 mL | INJECTION | INTRAMUSCULAR | Status: DC | PRN
Start: 2019-09-18 — End: 2019-09-20

## 2019-09-18 MED ORDER — TOPIRAMATE 100 MG TABLET
300.00 mg | ORAL_TABLET | Freq: Every evening | ORAL | Status: DC
Start: 2019-09-18 — End: 2019-09-20
  Administered 2019-09-19 (×2): 300 mg via ORAL
  Filled 2019-09-18 (×2): qty 3

## 2019-09-18 MED ORDER — MAGNESIUM OXIDE 400 MG (241.3 MG MAGNESIUM) TABLET
400.0000 mg | ORAL_TABLET | Freq: Every day | ORAL | Status: DC
Start: 2019-09-19 — End: 2019-09-20
  Administered 2019-09-19 – 2019-09-20 (×2): 400 mg via ORAL
  Filled 2019-09-18 (×2): qty 1

## 2019-09-18 MED ORDER — SODIUM CHLORIDE 0.9 % INTRAVENOUS SOLUTION
INTRAVENOUS | Status: DC
Start: 2019-09-18 — End: 2019-09-18
  Administered 2019-09-18: 22:00:00

## 2019-09-18 MED ORDER — DULOXETINE 30 MG CAPSULE,DELAYED RELEASE
30.00 mg | DELAYED_RELEASE_CAPSULE | Freq: Every evening | ORAL | Status: DC
Start: 2019-09-18 — End: 2019-09-20
  Administered 2019-09-19 (×2): 30 mg via ORAL
  Filled 2019-09-18 (×2): qty 1

## 2019-09-18 MED ORDER — RIBOFLAVIN (VITAMIN B2) 100 MG TABLET
400.0000 mg | ORAL_TABLET | Freq: Every day | ORAL | Status: DC
Start: 2019-09-19 — End: 2019-09-20
  Administered 2019-09-19 – 2019-09-20 (×2): 400 mg via ORAL
  Filled 2019-09-18 (×2): qty 4

## 2019-09-18 MED ORDER — SENNOSIDES 8.6 MG-DOCUSATE SODIUM 50 MG TABLET
1.0000 | ORAL_TABLET | Freq: Two times a day (BID) | ORAL | Status: DC | PRN
Start: 2019-09-18 — End: 2019-09-20

## 2019-09-18 MED ORDER — SPIRONOLACTONE 100 MG TABLET
100.00 mg | ORAL_TABLET | Freq: Every morning | ORAL | Status: DC
Start: 2019-09-19 — End: 2019-09-20
  Administered 2019-09-19 – 2019-09-20 (×2): 100 mg via ORAL
  Filled 2019-09-18 (×2): qty 1

## 2019-09-18 MED ORDER — SODIUM CHLORIDE 0.9 % (FLUSH) INJECTION SYRINGE
2.0000 mL | INJECTION | Freq: Three times a day (TID) | INTRAMUSCULAR | Status: DC
Start: 2019-09-18 — End: 2019-09-20
  Administered 2019-09-18: 0 mL
  Administered 2019-09-19: 2 mL
  Administered 2019-09-19: 06:00:00 0 mL
  Administered 2019-09-19 – 2019-09-20 (×2): 2 mL
  Administered 2019-09-20: 14:00:00

## 2019-09-18 MED ORDER — ACETAMINOPHEN 325 MG TABLET
650.0000 mg | ORAL_TABLET | ORAL | Status: DC | PRN
Start: 2019-09-18 — End: 2019-09-20

## 2019-09-18 MED ORDER — NORETHINDRONE 1 MG-ETHINYL ESTRADIOL 35 MCG TABLET
1.0000 | ORAL_TABLET | Freq: Every day | ORAL | Status: DC
Start: 2019-09-19 — End: 2019-09-19
  Administered 2019-09-19: 09:00:00

## 2019-09-18 MED ORDER — MULTIVITAMIN TABLET
1.0000 | ORAL_TABLET | Freq: Every day | ORAL | Status: DC
Start: 2019-09-19 — End: 2019-09-20
  Administered 2019-09-19 – 2019-09-20 (×2): 1 via ORAL
  Filled 2019-09-18 (×2): qty 1

## 2019-09-18 NOTE — H&P (Signed)
[facility]  Neurology Initial Consult    [facility], [name], 43 y.o. 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 Bell's palsy (age 80 years, symptom was left droopy face, LUE was drawn to her body. She was told etiology was allergy to sulfa. She did rehab for 1 year and came back to completely normal), who presents with 2-3 weeks history of sudden onset gait imbalance and falls when she woke-up one day, since then she has been stumbling and have left sided gait weakness where she feels she needs to hold on something on the right side. She also reports 9 months history of remarkable cognitive decline, difficulty functioning at work, shopping and sometimes forgetting 'who she is being married to'. 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   More recently the patient has been having facial drawing and speech problem for two weeks.     Pr neurology clinic note today:  Her and husband report: Abnormal speech for 2 weeks, she woke up with it and it has been same since then, her face is droopy on the left and has been the same, no difficulty closing your eye.   she would have episodes of facial asymmetry on episodes since she was 30, it has never lasted more than a day, she did not have trouble speaking.  gait is abnormal because she cannot feel her legs, is numb for 5-6 years up to knees, now numb on the thighs for 2-3 weeks.   LUE has had abnormal movements that she describes as dancing movements, going on for about 2 weeks, it has happened 3 times, lasted about 1.5 min, cannot make it stop, at the end of one of those her hand locked down for about 20 min and she also had pain. no abnormal movements in her legs.   For about 9 months works  as Radio broadcast assistant and has noticed difficulty with her cognition, misses things that she usually does not, can't concentrate, forgets who she is married to (thinks she is married to ex husband) but if corrected she is fine, ordering wrong food, numbers dont make sense cannot manage bills or money.  stopped working 1 week ago.   incontinences for a couple of months, does not happen when coughing or jumping, she could not tell that she had to pee on any of the episodes, this happened about 5 or 6 times.    not driving because "can't do anything right with his left side" quit driving 6 months ago.   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 done a month facility outside facility     Of note patient admitted to EMU a month ago for spell capture and they were PNES. During consult patient cries and asks me to fix her because right now she does not have a life    Admission Source:  Home  Advance Directives:  None-Discussed  Hospice involvement prior to admission?  Not applicable    Location (of pain): Quality (character of pain) Severity (minimal, mild, severe, scale or 1-10) Duration (how long has pain/sx present) Timing (when does pain/sx occur)  Context (activity at/before onset) Modifying Factors (what makes  pain/sx  Better/worse) Associate Sign/Sx (what accompanies main pain/sx)    Past Medical History:   Diagnosis Date   . Migraines          Past Surgical History:   Procedure Laterality Date   . DENTAL SURGERY     . HX DILATION AND CURETTAGE           Medications Prior to Admission     Prescriptions    cyanocobalamin, vitamin B-12, (VITAMIN B12 ORAL)    Take by mouth    DULoxetine (CYMBALTA DR) 30 mg Oral Capsule, Delayed Release(E.C.)    TK 1 C PO QD    estradiol (ESTRACE) 0.01 % (0.1 mg/gram) Vaginal Cream    2 g by Vaginal route    Lactobac no.41/Bifidobact no.7 (PROBIOTIC-10 ORAL)    Take by mouth    magnesium oxide 400 mg magnesium Oral Tablet    Take 1 Tab (400 mg total) by mouth Once a  day    MULTIVITAMIN (MULTIPLE VITAMINS ORAL)    Take by mouth Once a day    norethindrone-ethinyl estradiol (EQUIV TO: ORTHO NOVUM 1-35) 1-35 mg-mcg/tab Oral Tablet    Take 1 Tab by mouth Once a day    riboflavin, vitamin B2, 400 mg Oral Tablet    Take 1 Tab (400 mg total) by mouth Once a day    spironolactone (ALDACTONE) 100 mg Oral Tablet    Take 100 mg by mouth Every morning with breakfast    topiramate (TOPAMAX) 100 mg Oral Tablet    3 qhs        Allergies   Allergen Reactions   . Aimovig Autoinjector [Erenumab-Aooe]    . Bactrim [Sulfamethoxazole-Trimethoprim]    . Codeine    . Furosemide    . Hctz [Hydrochlorothiazide]    . Maxidex [Dexamethasone]    . Sulfa (Sulfonamides)    . Triamterene    . Trimethoprim      Social History     Tobacco Use   . Smoking status: Current Every Day Smoker     Packs/day: 1.00     Types: Cigarettes   . Smokeless tobacco: Never Used   Substance Use Topics   . Alcohol use: Not Currently     Family Medical History:     Problem Relation (Age of Onset)    Cancer Paternal Uncle    Diabetes Mother, Paternal Grandmother    Heart Attack Maternal Grandfather, Paternal Grandfather    Heart Surgery Father    High Cholesterol Father    Hypertension (High Blood Pressure) Mother, Father    Mental illness Mother    Other Maternal Grandfather, Paternal Grandmother    Seizures Maternal Grandmother    Stroke Mother, Maternal Grandmother, Maternal Grandfather    Tremor Mother          ROS: Other than ROS in the HPI, all other review of systems were negative except for: Genitourinary: positive for urinary incontinence    Exam:  Temperature: 37.1 C (98.8 F)  Heart Rate: 84  BP (Non-Invasive): 101/66  Respiratory Rate: 18  SpO2: 98 %  Pain Score (Numeric, Faces): 0  General: appears chronically ill  OS:[id number] without erythema or injection, mucous membranes moist.  Neck: no thyromegaly or lymphadenopathy  Carotids:Carotids normal without bruit  Respiratory: Clear to auscultation bilaterally.      Cardiovascular: regular rate and rhythm  Gastrointestinal: Soft, non-tender  Genitourinary: Deferred  Lymphatic/Immunologic/Hematologic: No lymphadenopathy  Psychiatric: Anxious  Musculoskeletal: Head atraumatic and normocephalic  Ophthalomscopic: normal  w/o hemorrhages, exudates, or papilledema  Glasgow:  Eye opening:  4 spontaneous, Verbal response:  5 oriented, Best motor response:  6 obeys commands  Mental status:  Level of Consciousness: alert  Orientations: Alert and oriented x 3  MemoryRegistration, Recall, and Following of commands is normal  AttentionsAttention and Concentration are normal  Knowledge: Good  Language: Normal  Speech: Slurred due to face asymmetry   Cranial nerves:   CN2: Visual acuity and fields intact  CN 3,4,6: EOMI, PERRLA  CN 5Facial sensation intact  CN 7Face asymmetry (non neurologic)  CN 8: Hearing grossly intact  CN 9,10: Palate symmetric and gag normal  CN 11: Sternocleidomastoid and Trapezius have normal strength.  CN 12: Tongue normal with no fasiculations or deviation  Gait, Coordination, and Reflexes:   Gait: Normal, difficulty with tandem gait (factitious appearing)  Coordination: Finger to nose: abnormal left    Muscle tone: WNL  Muscle exam: some give away weakness sporadically, otherwise as below   Arm Right Left Leg Right Left   Deltoid 5/5 5/5 Iliopsoas 5/5 5/5   Biceps 5/5 5/5 Quads 5/5 5/5   Triceps 5/5 5/5 Hamstrings 5/5 5/5   Wrist Extension 5/5 5/5 Ankle Dorsi Flexion 5/5 5/5   Wrist Flexion 5/5 5/5 Ankle Plantar Flexion 5/5 5/5   Interossei 5/5 5/5 Ankle Eversion 5/5 5/5   APB 5/5 5/5 Ankle Inversion 5/5 5/5       Reflexes   RJ BJ TJ KJ AJ Plantars Hoffman's   Right 2+ 2+ 2+ 2+ 2+ Downgoing Not present   Left 2+ 2+ 2+ 2+ 2+ Downgoing Not present     Sensory: Decreased sensation to light touch and vibration in the left side  Integumentary: Skin warm and dry  Diabetes Monitors:  Patient not a diabetic.    Labs:    I have reviewed all lab results.  Lab Results Today:     Results for orders placed or performed during the hospital encounter of 09/18/19 (from the past 24 hour(s))   ECG 12-LEAD   Result Value Ref Range    Ventricular rate 76 BPM    Atrial Rate 76 BPM    PR Interval 116 ms    QRS Duration 72 ms    QT Interval 376 ms    QTC Calculation 423 ms    Calculated P Axis 62 degrees    Calculated R Axis 56 degrees    Calculated T Axis 62 degrees       Review of reports and notes reveal:   vEEG 9/19- 08/17/2019  INTERPRETATION:  This is a normal awake and asleep EEG.  HISTORY:  This is a 43 year old female with a clinical concern for seizures.  This study was requested to evaluate for interictal abnormalities.  REPORT:  This is a digitally acquired video EEG done using the standard 10-20 system of electrode placement.  This EEG runs between 4:32 p.m. on August 16, 2019, until 12:21 p.m. on August 17, 2019.  This time period recorded the following.  CLINICAL EVENTS:  There was 1 pushbutton event initiated for an episode during which the patient has the abrupt onset of asynchronous shaking and unresponsiveness.  During this time, there is no change in the EEG to suggest any kind of electrographic seizure or epileptiform discharge.  Throughout the study, there is a normal background frequency of approximately 9-10 Hz over the posterior head regions.  Both sleep and wake states are recorded.  CLINICAL CORRELATION:  The EEG described above  is within normal limits.  The spell captured is nonepileptic in nature.    vEEG 9/18- 08/16/2019  INTERPRETATION:  This is a normal awake and asleep EEG.  REPORT:  This is a digitally acquired video EEG done using the standard 10-20 system of electrode placement.  This EEG runs between 4:32 p.m. on August 15, 2019, until 4:32 p.m. on August 16, 2019.  This time period recorded the following.  CLINICAL EVENTS:  There are 2 pushbutton events, 1 for an episode during which the patient complains of head pain and another for an episode during  which the patient has a sudden onset of asynchronous tremoring and unresponsiveness.  During both of these events, there is no change in the EEG to suggest any kind of electrographic seizure or epileptiform discharge.  Throughout the study, there is a normal background frequency of approximately 9 to 10 Hz over the posterior head regions.  Both sleep and wake states are recorded.  CLINICAL CORRELATION:  The EEG described above is within normal limits.  The events captured are nonepileptic in nature.    MRI brain w/wo 08/17/19: Small subcentimeter focus of enhancement and signal abnormality in the  right frontal lobe. Findings is nonspecific and may represent  inflammatory/demyelinating disease. Single metastatic lesion could have  similar appearance in the appropriate clinical setting.    MRI C Spine w/wo 08/17/19 Unremarkable MRI of the cervical spine.    MRI T Spine 920/20 Stable T5 vertebral body atypical hemangioma. Otherwise unremarkable MRI  of the thoracic spine.    Independent Interpretation of images or specimens:  No new images or recent studies to review at this time.    Assessment/Plan:  Active Hospital Problems    Diagnosis   . Cognitive decline     75 y,o F with reported history of stroke (no evidence of it in recent MRI), migraine and PNES who comes with multiple symptoms some of which are chronic but include slurred speech, asymmetric face (fluctuates on exam), gait problems, BLE numbness,  LUEs abnormal movements, episodes of urinary incontinence and cognitive changes.   Her story is atypical and exam is remarkable for subjective sensory loss of the left side, difficulty with tandem gait and left hand fine motor skills.  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     Variable neurologic complaints most significant for  cognitive decline and L hand fine motor skill abnormality   - direct admission to neurology service with neuro checks and vitals  - MRI brain w/wo contrast (previous abnormal MRI as above)  - MRI T-C spine w contrast   - Work-up for enhancing lesion with LP and CSF studies including oligoclonal bands and infection panel)  - Will consider EEG if patient has any of the LUE spells  -Continue her home Topamax and magnesium  for headaches     Chronic medical conditions  she takes Spironolactone   she takes Duloxetine   she takes multivitamins  she takes OCP     DNR Status this admission:  Full Code  Palliative/Supportive Care consulted?  no  Hospice Consulted?  Not applicable    Current Comorbid Conditions - Neurology Consult  Compression of the Brain:  no  Coma (GCS less than 8):Coma -Not applicable  TIA not applicable  Encephalopathy:  no  Encephalitis-Not applicable  Seizure-Not applicable   Respiratory  Failure/Other-Not applicable  No  Coagulopathy Not applicable  N/A    DVT/PE Prophylaxis: Heparin      Jasmine Cory, MD 09/18/2019, 22:01  [facility]  Neurology, PGY3      I saw and examined the patient.  I reviewed the resident's note.  I agree with the findings and plan of care as documented in the resident's note.  Any exceptions/additions are edited/noted.    This morning my examination of the patient is pertinent for: Decreased activation of the left lower facial muscles. On palpation of the neck, activation of the left platysma noted. Left arm drift without pronation is present. On finger to nose testing with eyes closed, patient is able to rapidly touch her right index finger to her nose, slower to perform the task on the left, but was able to touch her nose. Of note, IV is present in the left cubital fossa. On finger-nose-finger testing with eyes open, patient rapidly performs the task and is accurate on the right. On the left arm she is slower to perform the task, but correctly touched the  examiner's finger and her nose without errors.    The episodes of abnormal left arm movement are described as: Tapping of the left hand, followed by abnormal movement of the left wrist, movement of the left elbow, with patient then raising left arm and the arm remaining elevated for a period. Only four total such episodes have happened to date.    Prior imaging is significant for a small right frontal area of enhancement, which is not located over the primary motor cortex.    We will obtain imaging as outlined above and reassess.    Denyce Robert, MD

## 2019-09-18 NOTE — Care Management Notes (Signed)
[facility] DIRECT ADMIT    RESERVATION INFORMATION  Received call from:   Phone:   Referring Provider: Lilli Light, [place]   Referring Provider Phone: [phone]    Transfer Source: POC     Transfer Emergent:  Urgent  Transfer Reason: Patient not currently Hospitalized  Transfer Comments:   Admitted on:    Pt Class and Level of Care:    Diagnosis: Slurred speech    COVID-19 Confirmed Positive:no  COVID-19 Pending Result (PUI) (if yes, add date test was sent to lab if known): no    Admitting Pt Class and Level of Care: Inpatient, Semi-Private,   Accepted By: Denyce Robert, NEUROLOGY 1    *Send Text Page to accepting service MD. *

## 2019-09-18 NOTE — Progress Notes (Deleted)
RE:  Soyla Spirk          1976-02-28    Dear Melchor Alfredo Martinez, MD:    I had the pleasure of seeing Jasmine Brown in follow-up at the [place] on 09/18/2019.  Today she reports that her headaches have {Improved/no change/worse:13112}.  She is currently taking {HEADACHE PREVENTION MEDS (AMB):[phone]} for prevention and using {HEADACHE ABORTIVE MEDS (AMB):[phone]} as needed for her headaches.  Severe Headaches: {NUMBERS 0-35:20762} per {DAY WK MO:21094}  Total Headaches: {NUMBERS 0-35:20762} per {DAY WK MO:21094}  Headache Free Days: {NUMBERS 0-35:20762} per {DAY WK MO:21094}  Headache Description: {Headache location & desc:5282}.  Rated as ***/10.  Response to abortive medications: {GOOD/FAIR/POOR:19953}  Side effects to medications: ***    Results of previous testing: ***    Current Outpatient Medications   Medication Sig Dispense Refill   . cyanocobalamin, vitamin B-12, (VITAMIN B12 ORAL) Take by mouth     . estradiol (ESTRACE) 0.01 % (0.1 mg/gram) Vaginal Cream 2 g by Vaginal route     . Lactobac no.41/Bifidobact no.7 (PROBIOTIC-10 ORAL) Take by mouth     . magnesium oxide 400 mg magnesium Oral Tablet Take 1 Tab (400 mg total) by mouth Once a day     . MULTIVITAMIN (MULTIPLE VITAMINS ORAL) Take by mouth Once a day     . norethindrone-ethinyl estradiol (EQUIV TO: ORTHO NOVUM 1-35) 1-35 mg-mcg/tab Oral Tablet Take 1 Tab by mouth Once a day     . riboflavin, vitamin B2, 400 mg Oral Tablet Take 1 Tab (400 mg total) by mouth Once a day     . spironolactone (ALDACTONE) 100 mg Oral Tablet Take 100 mg by mouth Every morning with breakfast     . topiramate (TOPAMAX) 100 mg Oral Tablet 3 qhs 90 Tab 11     No current facility-administered medications for this visit.         Past medical history, family history, and social history have been reviewed and confirmed.    Sleep:{SLEEP PROBLEMS PED NEURO (AMB):[phone]}  Mood:{MOOD ASSESSMENT BMED (AMB):[phone]}    There were no vitals filed for this  visit.        General: appears in good health  Orientation: Alert and oriented x 3  Memory: Registration, Recall, and Following of commands is normal  Attention: Attention and Concentration are normal  Knowledge: Good  Language: Normal  Speech: Normal    Cranial Nerves:  2-12 Normal   Gait Normal  Coordination Normal    Motor  Normal strength throughout      No diagnosis found.     The patient will follow up with me in 4-6 months or sooner if needed.    The patient was seen independently.    Jodi Geralds, APRN,FNP-BC 09/18/2019, 13:51  [address]

## 2019-09-18 NOTE — Progress Notes (Signed)
[facility] Department of Neurology      Operated by [facility]  Outpatient History and Physical  Date:  09/18/2019  Name: Jasmine Brown  MRN: [id number]  Age:  43 y.o.  Referring Physician:Libell, Shanon Brow, MD  [place]  [place],  [address]    Consult: Yes    PCP:  Melchor Alfredo Martinez, MD    CC:  Slurred Speech and Numbness (legs)      History Obtained from:  Patient and Family    HPI: 43 y.o F with PMH of stroke (? symptom was left droopy face, LUE was drawn to her body. age 89. told etiology was allergy to sulfa. says she did rehab for 1 year and came back to completely normal), migraine who follows with headache clinic usually but today was placed on general clinic due to multiple new symptoms.    Her and husband report:  Abnormal speech for 2 weeks, she woke up with it and it has been same since then, her face is droopy on the left and has been the same, no difficulty closing your eye.   she would have episodes of facial asymmetry on episodes since she was 30, it has never lasted more than a day, she did not have trouble speaking.  gait is abnormal because she cannot feel her legs, is numb for 5-6 years up to knees, now numb on the thighs for 2-3 weeks.   LUE has had abnormal movements that she describes as dancing movements, going on for about 2 weeks, it has happened 3 times, lasted about 1.5 min, cannot make it stop, at the end of one of those her hand locked down for about 20 min and she also had pain. no abnormal movements in her legs.   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  stopped working 1 week ago.   incontinences for a couple of months, does not happen when coughing or jumping, she could not tell that she had  to pee on any of the episodes, this happened about 5 or 6 times.    not driving because "can't do anything right with his left side" quit driving 6 months ago.   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 Migraines            Medications:   Outpatient Medications Marked as Taking for the 09/18/19 encounter (Office Visit) with Lilli Light, Verdis Frederickson, MD   Medication Sig   . cyanocobalamin, vitamin B-12, (VITAMIN B12 ORAL) Take by mouth   . DULoxetine (CYMBALTA DR) 30 mg Oral Capsule, Delayed Release(E.C.) TK 1 C PO QD   . estradiol (ESTRACE) 0.01 % (0.1 mg/gram) Vaginal Cream 2 g by Vaginal route   . Lactobac no.41/Bifidobact no.7 (PROBIOTIC-10 ORAL) Take by mouth   .  magnesium oxide 400 mg magnesium Oral Tablet Take 1 Tab (400 mg total) by mouth Once a day   . MULTIVITAMIN (MULTIPLE VITAMINS ORAL) Take by mouth Once a day   . norethindrone-ethinyl estradiol (EQUIV TO: ORTHO NOVUM 1-35) 1-35 mg-mcg/tab Oral Tablet Take 1 Tab by mouth Once a day   . riboflavin, vitamin B2, 400 mg Oral Tablet Take 1 Tab (400 mg total) by mouth Once a day   . spironolactone (ALDACTONE) 100 mg Oral Tablet Take 100 mg by mouth Every morning with breakfast   . topiramate (TOPAMAX) 100 mg Oral Tablet 3 qhs       Allergies:   Allergies   Allergen Reactions   . Aimovig Autoinjector [Erenumab-Aooe]    . Bactrim [Sulfamethoxazole-Trimethoprim]    . Codeine    . Furosemide    . Hctz [Hydrochlorothiazide]    . Maxidex [Dexamethasone]    . Sulfa (Sulfonamides)    . Triamterene    . Trimethoprim        Family History:   Family Medical History:     Problem Relation (Age of Onset)    Cancer Paternal Uncle    Diabetes Mother,  Paternal Grandmother    Heart Attack Maternal Grandfather, Paternal Grandfather    Heart Surgery Father    High Cholesterol Father    Hypertension (High Blood Pressure) Mother, Father    Mental illness Mother    Other Maternal Grandfather, Paternal Grandmother    Seizures Maternal Grandmother    Stroke Mother, Maternal Grandmother, Maternal Grandfather    Tremor Mother              Surgical History:   Past Surgical History:   Procedure Laterality Date   . DENTAL SURGERY     . HX DILATION AND CURETTAGE             Social History:    Social History     Socioeconomic History   . Marital status: Married     Spouse name: Daniylah Mikkola   . Number of children: 2   . Years of education: 75   . Highest education level: Not on file   Occupational History   . Occupation: Real Estate   Tobacco Use   . Smoking status: Current Every Day Smoker     Packs/day: 1.00     Types: Cigarettes   . Smokeless tobacco: Never Used   Substance and Sexual Activity   . Alcohol use: Not Currently       Review of Systems  Constitutional-No fever  Eyes- No visual change  ENT- Hearing normal  CV- No chest pain  Resp- No Shortness of breath  GI- No diarrhea  GU- Bladder normal  MS- No Arthritis  Skin- No rash  Psych- No depression  Endo- No DM  Heme- No nodes    PHYSICAL EXAM:    BP (!) 133/94   Pulse (!) 117   Temp 37.4 C (99.4 F)   Wt 61.3 kg (135 lb 2.3 oz)   SpO2 100%   BMI 27.30 kg/m       Appearance:No Acute Distress  Ophthalmoscopic: Disc Flat, Normal fundus  Carotid/Heart/Peripheral Vascular: No Bruits, RRR  Orientation: Awake, Alert, and Oriented x 3  Mental status:  Memory: Registation 3/3 Recall 3/3  Attention: Normal  Knowledge: Appropriate  Language: No aphasia  Speech: abnormal but is an atypical type of slurring, i understand some word well and other not so it seems to fluctuate some  Cranial Nerves:  2 No Visual Defect on Confrontation; Pupils round, equal, reactive tolight  3,4,6 Extraocular Movements Intact; no nystagmus  5  Facial Sensation Intact  7 facial asymmetry with fluctuating degree of severity in the lower left half of her face, when distracted it does seem she has mild nasolabial flattening, no forehead involvement  8 Intact hearing  9,10 Palate symmetric  11 Good shoulder shrug  12 Tongue Midline  Gait: somewhat wide stance but overall ok, good turns, cannot  perform tandem as she does many extra movements including flexion of her knee and standing on just one leg but not taking the step, was able to catch herself, this extra movements do not seem physiologic  Coordination: No ataxia with finger to nose testing but has small random non rhythmic jerks, and breaks the movement with frequent pauses L>R, left tapping is slow but wide and at times reduced amplitude    Sensory: reduced to pinprick and vibration in all extremities compared to face but worse in the left side, says does not feel sharpness on legs at all but feel the pressure   Muscle Tone: Normal  Muscle exam despite giveaway moments (worse in BLEs proximally) she is actually full strength but when holding BUEs up the left drifts down  Arm Right Left Leg Right Left   Deltoid 5/5 5/5 Iliopsoas 5/5 5/5   Biceps 5/5 5/5 Quads 5/5 5/5   Triceps 5/5 5/5 Hamstrings 5/5 5/5   Wrist Extension 5/5 5/5 Ankle Dorsi Flexion 5/5 5/5   Wrist Flexion 5/5 5/5 Ankle Plantar Flexion 5/5 5/5   Interossei 5/5 5/5 Ankle Eversion 5/5 5/5   APB 5/5 5/5 Ankle Inversion 5/5 5/5       Reflexes   RJ BJ TJ KJ AJ Plantars Hoffman's   Right 2+ 2+ 2+ 2+ 2+ Downgoing Not present   Left 2+ 2+ 2+ 2+ 2+ Downgoing Not present     Personal review of  Diabetes Monitors  A1C - Glucose - Lipids Microalbumin   No results for input(s): HA1C, GLUCOSEFAST, CHOLESTEROL, HDLCHOL, LDLCHOL, LDLCHOLDIR, TRIG in the last 13140 hours. No results for input(s): MICALBRNUR, MICALBCRERAT in the last 13140 hours.     Diabetic foot exam:  No edema bilaterally.                         Outside records:  MRI brain w/wo  08/17/19: Small subcentimeter focus of enhancement and signal abnormality in the  right frontal lobe. Findings is nonspecific and may represent  inflammatory/demyelinating disease. Single metastatic lesion could have  similar appearance in the appropriate clinical setting.    MRI C Spine w/wo 08/17/19 Unremarkable MRI of the cervical spine.    MRI T Spine 920/20 Stable T5 vertebral body atypical hemangioma. Otherwise unremarkable MRI  of the thoracic spine.    Assessment/Plan:     ICD-10-CM    1. Slurred speech  R47.81      No orders of the defined types were placed in this encounter.    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 Her story is a little atypical and I find it difficult to localize, specially since she had imaging done less than a month ago, granted  she did have a foci of enhancement on her brain which is why I think she needs expedited reimaging to make sure there is no new lesions. I do think she has a functional component that makes it hard to tease out if there are any actual deficits on exam, all changes I am concerned about are fairly subtle (LUE drift, L nasolabial flattening)    slurred speech, asymmetric face (fluctuates on exam), gait problems, BLE numbness,  LUEs abnormal movements, episodes of urinary incontinence and cognitive changes  - direct admission to neurology service  - MRI brain w/wo contrast (previous abnormal MRI)  - MRI t spine contrast given numbness in BLE has ascended   - consider EEG if patient has any of the LUE spells      M. Jerl Mina   PGY 3 Neurology resident - [phone]  09/18/19 17:36      I saw and examined the patient.  I reviewed the resident's note.  I agree with the  findings and plan of care as documented in the resident's note.  Any exceptions/additions are edited/noted.    Gemma Payor, MD

## 2019-09-18 NOTE — Nurses Notes (Signed)
Patient arrived to unit as DA from [place]. Oriented to unit. Bed alarm in place for safety. VSS and Assessment per flowsheet.

## 2019-09-19 ENCOUNTER — Observation Stay (HOSPITAL_BASED_OUTPATIENT_CLINIC_OR_DEPARTMENT_OTHER): Payer: BC Managed Care – PPO

## 2019-09-19 ENCOUNTER — Other Ambulatory Visit: Payer: Self-pay

## 2019-09-19 ENCOUNTER — Observation Stay (HOSPITAL_COMMUNITY): Payer: BC Managed Care – PPO

## 2019-09-19 DIAGNOSIS — R531 Weakness: Secondary | ICD-10-CM

## 2019-09-19 DIAGNOSIS — R200 Anesthesia of skin: Secondary | ICD-10-CM

## 2019-09-19 DIAGNOSIS — D1809 Hemangioma of other sites: Secondary | ICD-10-CM

## 2019-09-19 DIAGNOSIS — R569 Unspecified convulsions: Secondary | ICD-10-CM

## 2019-09-19 LAB — C-REACTIVE PROTEIN(CRP),INFLAMMATION: CRP INFLAMMATION: 1.1 mg/L (ref <=8.0)

## 2019-09-19 LAB — SEDIMENTATION RATE: ERYTHROCYTE SEDIMENTATION RATE (ESR): 11 mm/h (ref 0–20)

## 2019-09-19 MED ORDER — BIRTH CONTROL - PT OWN
1.0000 | Freq: Every day | Status: DC
Start: 2019-09-19 — End: 2019-09-20
  Administered 2019-09-19 – 2019-09-20 (×2): 1 via ORAL
  Filled 2019-09-19: qty 1

## 2019-09-19 MED ORDER — NICOTINE 14 MG/24 HR DAILY TRANSDERMAL PATCH
14.0000 mg | MEDICATED_PATCH | Freq: Every day | TRANSDERMAL | Status: DC
Start: 2019-09-19 — End: 2019-09-20
  Administered 2019-09-19: 14 mg via TRANSDERMAL
  Administered 2019-09-20: 17:00:00
  Administered 2019-09-20: 14 mg via TRANSDERMAL
  Administered 2019-09-20: 08:00:00 via TRANSDERMAL
  Filled 2019-09-19 (×2): qty 1

## 2019-09-19 MED ORDER — ALPRAZOLAM 0.5 MG TABLET
1.0000 mg | ORAL_TABLET | Freq: Two times a day (BID) | ORAL | Status: DC | PRN
Start: 2019-09-19 — End: 2019-09-20
  Administered 2019-09-19 – 2019-09-20 (×2): 1 mg via ORAL
  Filled 2019-09-19 (×2): qty 2

## 2019-09-19 NOTE — Care Management Notes (Signed)
[facility] Management Initial Evaluation    Patient Name: Jasmine Brown  Date of Birth: 05-30-1976  Sex: female  Date/Time of Admission: 09/18/2019  8:59 PM  Room/Bed: 08/A  Payor: BLUE CROSS BLUE SHIELD / Plan: [place] BC/BS PPO / Product Type: PPO /   Primary Care Providers:  Zadie Rhine, FNP, FNP (General)    Pharmacy Info:   Preferred [facility] [place] [address]    [place] [address]    Phone: [phone] Fax: [phone]    Not a 24 hour pharmacy; exact hours not known.            Emergency Contact Info:   Extended Emergency Contact Information  Primary Emergency Contact: Jasmine Brown  Mobile Phone: [phone]  Relation: Husband    History:   Jasmine Brown is a 43 y.o., female, admitted for Cognitive Decline.     Height/Weight: 149.9 cm (4' 11" ) / 61.3 kg (135 lb 2.3 oz)     LOS: 1 day   Admitting Diagnosis: Cognitive decline [R41.89]    Assessment:      09/19/19 1621   Assessment Details   Assessment Type Admission   Date of Care Management Update 09/19/19   Date of Next DCP Update 09/22/19   Readmission   Is this a readmission? No   Care Management Plan   Discharge Planning Status initial meeting   Projected Discharge Date 09/20/19   Discharge plan discussed with: Patient   CM will evaluate for rehabilitation potential yes   Patient choice offered to patient/family no   Form for patient choice reviewed/signed and on chart no   Discharge Needs Assessment   Was Referral sent to CPS/CYS? No   Was referral sent to APS? No   Equipment Currently Used at Home   (Pt reports having a walker and cane; however, she doesn't use them.)   Equipment Needed After Discharge shower chair   Discharge Facility/Level of Care Needs Home (Patient/Family Member/other)(code 1)   Transportation Available car;family or friend will provide  (Pt reports that her husband, Jasmine Brown, will provide transportation.)    Referral Information   Admission Type observation   Address Verified verified-no changes   Arrived From home or self-care   Insurance Verified verified-no change   ADVANCE DIRECTIVES   Does the Patient have an Advance Directive? Yes, Patient Does Have Advance Directive for Healthcare Treatment   Type of Advance Directive Completed Medical Power of Attorney   Copy of Advance Directives in Chart? 6   Patient Requests Assistance in Having Advance Directive Notarized. N/A   LAY CAREGIVER    Appointed Lay Caregiver? I Decline   Employment/Financial   Patient has Prescription Coverage?  Yes        Name of Insurance Coverage for Medications BCBS   Financial Concerns none   Living Environment   Select an age group to open "lives with" row.  Adult   Lives With spouse  (Pt resides with her husband, Jasmine Brown.)   Living Arrangements house   Able to Return to Prior Arrangements yes   Home Safety   Home Assessment: Stairs in Home  (Pt reports that she doesn't have to go upstairs.)   Home Accessibility stairs (2 railings present);stairs to enter home   Legal Issues   Do you have a court appointed guardian/conservator? No   Home Main  Entrance   Stair Railings, Main Entrance railings on both sides of stairs   Landing, Stairs, Main Entrance railings present   Number of Stairs, Main Entrance six     MSW met with pt to complete her initial assessment. Pt was admitted for Cognitive Decline. MRI needed. Possible LP. Plan vEEG. Pt confirmed address listed is her mailing address' however, her home address is: [address] [place], [place] [address]. Pt currently resides with her husband, Jasmine Brown. PCP was updated and pt stated she last saw her PCP on October 15th. Pharmacy and insurance information was confirmed. A copy of pt's MPOA was requested. PT/OT recommending home with assist and home with outpatient services. Pt stated at time of discharge, her husband Jasmine Brown) would provide transportation. Will continue to follow pt's discharge needs.              Discharge Plan:  Home (Patient/Family Member/other) (code 1)      The patient will continue to be evaluated for developing discharge needs.     Case Manager: Arbie Cookey, [place]  Phone: [phone]

## 2019-09-19 NOTE — Nurses Notes (Signed)
Service paged-- patient asking about her xanax 1 mg PO BID, states she takes it at home, asking for it here please. THanks!

## 2019-09-19 NOTE — Care Plan (Signed)
Patient up to chair with assist of one. Continues with left sided facial droop, speech slurred. ST, PT, OT seeing patient. EEG being completed today. Tolerating diet. Fall and seizure precautions continue. SQ heparin for DVT prophylaxis. Denies pain. Will continue to monitor    Problem: Adult Inpatient Plan of Care  Goal: Plan of Care Review  Outcome: Ongoing (see interventions/notes)  Flowsheets (Taken 09/19/2019 1031 by Reinaldo Meeker, SLP)  Plan of Care Reviewed With: patient  Goal: Patient-Specific Goal (Individualized)  Outcome: Ongoing (see interventions/notes)  Flowsheets  Taken 09/19/2019 0905 by Gevena Barre, PT  Individualized Care Needs: OOB with assist of 1   Taken 09/19/2019 1438 by Allegra Lai, RN  Anxieties, Fears or Concerns: none  Taken 09/19/2019 1031 by Reinaldo Meeker, SLP  Patient-Specific Goals (Include Timeframe): to get better  Goal: Absence of Hospital-Acquired Illness or Injury  Outcome: Ongoing (see interventions/notes)  Goal: Optimal Comfort and Wellbeing  Outcome: Ongoing (see interventions/notes)     Problem: Depression  Goal: Improved Mood  Outcome: Ongoing (see interventions/notes)     Problem: Mobility Impairment  Goal: Optimal Mobility  Outcome: Ongoing (see interventions/notes)     Problem: Fall Injury Risk  Goal: Absence of Fall and Fall-Related Injury  Outcome: Ongoing (see interventions/notes)

## 2019-09-19 NOTE — Nurses Notes (Signed)
[place] patient requesting nicotine patch  [id number]

## 2019-09-19 NOTE — Progress Notes (Signed)
Interval Progress Note:    Patient had episode of twitching left hand and arm while on routine EEG, triggered by flashing lights. Will review EEG with epileptologist; episode likely not epileptic.     [place], DO  09/19/2019, 15:08

## 2019-09-19 NOTE — Care Plan (Signed)
[place]  Physical Therapy Initial Evaluation    Patient Name: Jasmine Brown  Date of Birth: Oct 09, 1976  Height: Height: 149.9 cm (4\' 11" )  Weight: Weight: 61.3 kg (135 lb 2.3 oz)  Room/Bed: 08/A  Payor: BLUE [employer] / Plan: [place] PPO / Product Type: PPO /     Assessment:      Patient was seen for an evaluation and she tolerated the session well.  She reported that she was unable to negotiate stairs without her husband and she only ascends with her R LE and Right rail.  She exhibited 4+/5 strength in her L LE with giveway moments with sustained muscle testing in each muscle group of her Left leg.  She was able to perform sit-stand transfer with stand by assist using a FWW.  Initiated walking with a FWW, but she was not using the device at all, so the FWW was discarded for the rest of the walk.  She was able to ambulate 254ft with SPV, demonstrating occasional dyscoordination with her L LE, but no LOB noted.  Currently recommend DC to home with assist and outpatient physical therapy once medically stable.      Discharge Needs:    Equipment Recommendation: TBD        Discharge Disposition: home with assist, home with outpatient services    JUSTIFICATION OF DISCHARGE RECOMMENDATION   Based on current diagnosis, functional performance prior to admission, and current functional performance, this patient requires continued PT services in home with assist, home with outpatient services in order to achieve significant functional improvements in these deficit areas: gait, locomotion, and balance, muscle performance.        Plan:   Current Intervention: balance training, gait training, patient/family education, ROM (range of motion), stair training, strengthening, stretching, transfer training  To provide physical therapy services 1x/day, minimum of 2x/week  for duration of until discharge.    The risks/benefits of therapy have been discussed with the  patient/caregiver and he/she is in agreement with the established plan of care.       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 Bell's palsy (age 30 years, symptom was left droopy face, LUE was drawn to her body. She was told etiology was allergy to sulfa. She did rehab for 1 year and came back to completely normal), who presents with 2-3 weeks history of sudden onset gait imbalance and falls when she woke-up one day, since then she has been stumbling and have left sided gait weakness where she feels she needs to hold on something on the right side. She also reports 9 months history of remarkable cognitive decline, difficulty functioning at work, shopping and sometimes forgetting 'who she is being married to'. 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."   Medical Lines PIV Line;Telemetry   Respiratory Status room air   Existing Precautions/Restrictions fall precautions;full code;aspiration precautions   Mutuality/Individual Preferences   Anxieties, Fears or Concerns "will therapy fix this?"   Individualized Care Needs OOB with assist of 1    Living Environment   Lives With spouse   Home Accessibility bed and  bath on same level;stairs to enter home;stairs (1 railing present)   Home Main Entrance   Number of Stairs, Main Entrance six   Stair Railings, Main Entrance railing on right side (ascending)   Functional Level Prior   Ambulation 0 - independent   Transferring 0 - independent   Toileting 0 - independent   Prior Functional Level Comment reported that her husband has to help her with stairs or she does not negotiate them    Pre Treatment Status   Pre Treatment Patient Status Patient supine in bed;Call light within  reach;Telephone within reach;Sitter select activated;Nurse approved session   Support Present Pre Treatment  None   Communication Pre Treatment  Nurse   Cognitive Assessment/Interventions   Behavior/Mood Observations alert;cooperative   Orientation Status oriented x 4   Attention distractible   Follows Commands WFL   Pain Assessment   Pretreatment Pain Rating 1/10   Posttreatment Pain Rating 1/10   Pre/Posttreatment Pain Comment back    RLE Assessment   RLE Assessment WFL for stated baseline   RLE Other patient reported having poor sensation in her foot    LLE Assessment   LLE Assessment X-Exceptions   LLE Strength 4+/5 throughout, with giveway briefly with sustained contraction   LLE Other patient reported having poor sensation in her foot    Bed Mobility Assessment/Treatment   Bed Mobility, Assistive Device Head of Bed Elevated   Supine-Sit Independence stand-by assistance   Transfer Assessment/Treatment   Sit-Stand Independence stand-by assistance   Stand-Sit Independence supervision required   Sit-Stand-Sit, Assist Device walker, front wheeled   Transfer Safety Issues balance decreased during turns   Transfer Impairments balance impaired;coordination impaired;strength decreased   Gait Assessment/Treatment   Independence  supervision required   Assistive Device    (started with FWW, then no device )   Distance in Feet 240ft    Safety Issues  balance decreased during turns   Impairments  balance impaired;coordination impaired   Comment occasional dyscoordination with her L LE, but no buckling or LOB   Balance Skill Training   Sitting Balance: Static fair + balance   Sitting, Dynamic (Balance) fair + balance   Sit-to-Stand Balance fair balance   Standing Balance: Static fair balance   Standing Balance: Dynamic fair - balance   Post Treatment Status   Post Treatment Patient Status Patient sitting in bedside chair or w/c;Call light within reach;Telephone within reach;Sitter select activated   Support Present Post  Treatment  None   Plan of Care Review   Plan Of Care Reviewed With patient   Basic Mobility Am-PAC/6Clicks Score   Turning in bed without bedrails 4   Lying on back to sitting on edge of flat bed 4   Moving to and from a bed to a chair 3   Standing up from chair 3   Walk in room 3   Climbing 3-5 steps with railing 3   6 Clicks Raw Score total 20   Standardized (t-scale) score 43.99   CMS 0-100% Score 33.32   CMS Modifier CJ   Patient Mobility Goal (JHHLM) 8- Walk 250 feet or more 3X/day   Exercise/Activity Level Performed 8- Walked 250 feet or more   Physical Therapy Clinical Impression   Assessment Patient was seen for an evaluation and she tolerated the session well.  She reported that she was unable to negotiate stairs without her husband and she only ascends with her R LE and Right rail.  She exhibited 4+/5 strength in  her L LE with giveway moments with sustained muscle testing in each muscle group of her Left leg.  She was able to perform sit-stand transfer with stand by assist using a FWW.  Initiated walking with a FWW, but she was not using the device at all, so the FWW was discarded for the rest of the walk.  She was able to ambulate 242ft with SPV, demonstrating occasional dyscoordination with her L LE, but no LOB noted.  Currently recommend DC to home with assist and outpatient physical therapy once medically stable.     Criteria for Skilled Therapeutic yes   Impairments Found (describe specific impairments) gait, locomotion, and balance;muscle performance   Rehab Potential good, to achieve stated therapy goals   Therapy Frequency 1x/day;minimum of 2x/week   Predicted Duration of Therapy Intervention (days/wks) until discharge   Anticipated Equipment Needs at Discharge (PT) TBD   Anticipated Discharge Disposition home with assist;home with outpatient services   Evaluation Complexity Justification   Patient History: Co-morbidity/factors that impact Plan of [facility] or more other medical  co-morbidity;Complicated prior level of function/decline in function;Home accessibility barriers/factors   Examination Components Range of motion;Strength;Balance;Bed mobility;Transfers;Ambulation   Presentation Evolving: Symptoms, complaints, characteristics of condition changing &/or cognitive deficits present   Clinical Decision Making Moderate complexity   Evaluation Complexity Moderate complexity   Care Plan Goals   PT Rehab Goals Gait Training Goal;Stairs Training Goal;Transfer Training Goal   Gait Training  Goal, Distance to Achieve   Gait Training  Goal, Date Established 09/19/19   Gait Training  Goal, Time to Achieve by discharge   Gait Training  Goal, Independence Level stand-by assistance   Gait Training  Goal, Distance to Achieve 220ft    Stairs Training Goal   Stairs Training Goal, Date Established 09/19/19   Stairs Training Goal, Time to Achieve by discharge   Stairs Training Goal, Independence Level stand-by assistance   Stairs Training Goal, Assist Device handrail, right   Stairs Training Goal, Number of Stairs to Achieve 12   Transfer Training Goal   Transfer Training Goal, Date Established 09/19/19   Transfer Training Goal, Time to Achieve by discharge   Transfer Training Goal, Activity Type bed-to-chair/chair-to-bed;sit-to-stand/stand-to-sit   Transfer Training Goal, Independence Level stand-by assistance   Planned Therapy Interventions, PT Eval   Planned Therapy Interventions (PT) balance training;gait training;patient/family education;ROM (range of motion);stair training;strengthening;stretching;transfer training       Therapist:   Gevena Barre, PT   Pager #: [phone]

## 2019-09-19 NOTE — Care Plan (Signed)
Problem: Adult Inpatient Plan of Care  Goal: Plan of Care Review  Outcome: Ongoing (see interventions/notes)  Note: Pt was admitted for Cognitive Decline. MRI needed. Possible LP. Plan vEEG. Pt confirmed address listed is her mailing address' however, her home address is: [address] [place], [place] [address]. Pt currently resides with her husband, Jasmine Brown. PCP was updated and pt stated she last saw her PCP on October 15th. Pharmacy and insurance information was confirmed. A copy of pt's MPOA was requested. PT/OT recommending home with assist and home with outpatient services. Pt stated at time of discharge, her husband Jasmine Brown) would provide transportation. Will continue to follow pt's discharge needs.

## 2019-09-19 NOTE — Nurses Notes (Signed)
[facility] BP 87/60 manual. Patient asymptomatic  [phone]

## 2019-09-19 NOTE — Care Plan (Signed)
[place]  Speech Therapy Initial Swallow and Speech/Language Evaluation    Patient Name: Bobi Daudelin  Date of Birth: 1975-11-29  Height: Height: 149.9 cm (4' 11")  Weight: Weight: 61.3 kg (135 lb 2.3 oz)  Room/Bed: 08/A  Payor: BLUE CROSS BLUE SHIELD / Plan: [place] BC/BS PPO / Product Type: PPO /     Swallow Assessment:   Functional Level At Time Of Eval: No overt s/s of aspiration with any conisistency. Pt denies choking or issues with pocketing.      SLP Diet Recommendation: regular solid, thin liquids  Aspiration Precautions:    Signs/Symptoms of Aspiration Noted (Swallowing): none       Speech Assessment:  Functional Level At Time Of Evaluation: Pt with notable "r" distortion in conversation. No other distortions or substitutions observed. Pt given oral motor exercises and was able to complete them without difficulty. Pt would benefit from outpt speech tx upon d/c.         Speech Discharge Disposition:   (Will need justification of DC recommendation if placement is required.)  JUSTIFICATION OF DISCHARGE RECOMMENDATION  Based on current diagnosis, functional performance prior to admission, and current functional performance, this patient requires continued SLP services in   in order to achieve significant functional improvements for Speech and Language.    Plan:  To provide Swallow Therapy services  for duration of .  To provide Speech Therapy services 2-3 times/wk for duration of  .    The risks and benefits of therapy have been discussed with the patient/caregiver and he/she is in agreement with the established plan of care.    Subjective & Objective:     09/19/19 1031   Therapist Pager   SLP Pager Margreta Journey [phone]   Rehab Session   Document Type evaluation   Total SLP Minutes: 49   Patient Effort good   Symptoms Noted During/After Treatment none   General Information   Patient Profile Reviewed yes   Referring Physician sener   General Observations of Patient  Pt alert in bedside chair   Pertinent History of Current Functional Problem 54 y,o F with reported history of stroke (no evidence of it in recent MRI), migraine and PNES who comes with multiple symptoms some of which are chronic but include slurred speech, asymmetric face (fluctuates on exam), gait problems, BLE numbness, LUEs abnormal movements, episodes of urinary incontinence and cognitive changes.    Mutuality/Individual Preferences   Patient-Specific Goals (Include Timeframe) to get better   Plan of Care Reviewed With patient   Functional Status Prior   Communication 0 - understands/communicates without difficulty   Swallowing 0 - swallows foods/liquids without difficulty   Coping/Psychosocial   Observed Emotional State calm;cooperative   Verbalized Emotional State acceptance   Trust Relationship/Rapport care explained;choices provided   Cognitive   Cognitive/Neuro/Behavioral WDL WDL   Orientation oriented x 4   Speech slurred   Pain Assessment   Pretreatment Pain Rating 0/10 - no pain   Posttreatment Pain Rating 0/10 - no pain   Oxygenation   Oxygenation Room air   Oral Motor Structure and Function   Additional Documentation Oral Motor Structure/Functional Assessment (Group)   Oral Motor Structure/Functional Assessment   Dentition (Oral Motor) upper dentures  (lower natural dentition)   Secretion Management (Oral Motor Assessment) WFL   Mucosal Quality (Oral Motor Assessment) good   Volitional Swallow (Oral Motor Assessment) no issues initiating volitional swallow   Volitional Cough (Oral Motor Assessment) no issues  initiating volitional cough   Oral Musculature (Oral Motor Assessment) oral labial impairment  (left facial weakness)   Non Instrumental/Clinical Swallow (NIS)   Additional Documentation Thin Liquid Trial (NIS) (Group);Solid Texture Trial (NIS) (Group)   Thin Liquid Trial (NIS)   Mode of Presentation, Thin Liquid (NIS) self-fed   Oral Phase Results, Thin Liquid (NIS) intact oral phase without  signs of dysfunction   Pharyngeal Phase Results, Thin Liquid (NIS) safe swallow, no signs/symptoms of aspiration or penetration   Laryngeal Observations, Thin Liquid (NIS) laryngeal function intact, normal movement bilaterally   Solid Texture Trial (NIS)   Mode of Presentation, Solid Food (NIS) self-fed   Oral Phase Results, Solid Food (NIS) intact oral phase without signs of dysfunction   Pharyngeal Phase Results, Solid Food (NIS) safe swallow, no signs/symptoms of aspiration or penetration   Laryngeal Observations, Solid Food (NIS) laryngeal function intact, normal movement bilaterally   Plan of Care Review   Plan Of Care Reviewed With patient   SLP Clinical Impression   Functional Level At Time Of Evaluation Pt with notable "r" distortion in conversation. No other distortions or substitutions observed. Pt given oral motor exercises and was able to complete them without difficulty. Pt would benefit from outpt speech tx upon d/c.    Criteria for Skilled Therapeutic Interventions Met (SLP Eval) skilled criteria for speech language intervention met   Rehab Potential (SLP Eval) good, to achieve stated therapy goals   Therapy Frequency (PT) 2-3 times/wk   Swallowing Clinical Impression   Functional Level At Time Of Eval No overt s/s of aspiration with any conisistency. Pt denies choking or issues with pocketing.    SLP Diet Recommendation regular solid;thin liquids   Signs/Symptoms of Aspiration Noted (Swallowing) none   Anticipated Discharge Disposition home with assist   Plan of care reviewed with: PT       Therapist:  Reinaldo Meeker, SLP   Pager #: [phone]  Phone #: [phone]

## 2019-09-19 NOTE — Care Plan (Addendum)
[facility]  Occupational Therapy Initial Evaluation    Patient Name: Jasmine Brown  Date of Birth: 01-Apr-1976  Height:  149.9 cm (_0 )  Weight:  61.3 kg (135 lb 2.3 oz)  Room/Bed: 08/A  Payor: BLUE CROSS BLUE [employer]IELD / Plan: [place] PPO / Product Type: PPO /     Assessment:      Ms. Bansal tolerated OT evaluation well this date. Patient presented with L-sided weakness in LUE and impaired coordination of BLEs. Patient also presented with impaired balance, reports needing spouse to be present for bathing and completing stairs at baseline secondary to fall risk. Patient completed ADLs with SUA-SBA, functional transfers with SBA-CGA, and functional mobility with CGA. At this time, recommend 24/7 assistance for safety and outpatient OT to address cognitive functions and higher level engagement in IADLs and working role that patient finds meaningful.      Discharge Needs:     Equipment Recommendation: shower chair    The patient presents with mobility limitations due to impaired balance and impaired strength that significantly impair/prevent patient's ability to participate in mobility-related activities of daily living (MRADLs) including  ambulation and transfers in order to safely complete, bathing. This functional mobility deficit can be sufficiently resolved with the use of a shower chair in order to decrease the risk of falls, morbidity, and mortality in performance of these MRADLs.  Patient is able to safely use this assistive device.    Discharge Disposition:  home with 24/7 assistance, home with outpatient services (both)   The above recommendation is based upon the current examination and evaluation performed on this date. As subsequent sessions are completed, recommendations will be updated accordingly.    JUSTIFICATION OF DISCHARGE RECOMMENDATION   Based on current diagnosis, functional performance prior to admission, and current functional performance, this  patient requires continued OT services in home with 24/7 assistance, home with outpatient services(both) in order to achieve significant functional improvements.    Plan:   Continue to follow patient according to established plan of care.  The risks/benefits of therapy have been discussed with the patient/caregiver and he/she is in agreement with the established plan of care.     Subjective & Objective:        09/19/19 4665   Therapist Pager   OT Assigned/ Pager # [phone]   Rehab Session   Document Type evaluation   Total OT Minutes: 34   Patient Effort good   Symptoms Noted During/After Treatment none   General Information   Patient Profile Reviewed yes   Onset of Illness/Injury or Date of Surgery 09/18/19   General Observations of Patient Patient reports that her LLE has been giving her the most difficulty recently. Patient stated that when she stands and walks, her legs do a motion as if they are "charging like a bull." Patient also reports random instances of LEs giving out on her without warning.   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 She was told etiology was allergy to sulfa. She did rehab for 1 year and came back to completely normal), who presents with 2-3 weeks history of sudden onset gait imbalance and falls when she woke-up one day, since then she has been stumbling and have left sided gait weakness where she feels she needs to hold on something on the  right side. She also reports 9 months history of remarkable cognitive decline, difficulty functioning at work, shopping and sometimes forgetting 'who she is being married to'. 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."   Medical Lines PIV Line;Telemetry   Respiratory Status room air   Existing Precautions/Restrictions fall precautions;full code;aspiration  precautions   Pre Treatment Status   Pre Treatment Patient Status Patient supine in bed;Call light within reach;Telephone within reach;Sitter select activated;Nurse approved session   Support Present Pre Treatment  None   Communication Pre Treatment  Nurse   Mutuality/Individual Preferences   Anxieties, Fears or Concerns "Will therapy fix this?"   Individualized Care Needs OOB with Assist x 1; encourage OOB activity, independence in ADLs   Patient-Specific Goals (Include Timeframe) to get better   Plan of Care Reviewed With patient   Living Environment   Lives With spouse   Living Arrangements house   Home Assessment: Stairs in [place] Accessibility bed and bath on same level;stairs to enter home;stairs (1 railing present)   Transportation Available family or friend will provide   Home Main Entrance   Number of Stairs, Main Entrance six   Stair Railings, Main Entrance railing on right side (ascending)   Functional Level Prior   Ambulation 0 - independent  (Assist from spouse for completing stairs)   Transferring 0 - independent   Toileting 0 - independent   Bathing 2 - assistive person   Dressing 2 - assistive person   Eating 0 - independent   Communication 2 - difficulty speaking (not related to language barrier)   Prior Functional Level Comment Patient reports hx of stroke, enjoyed racing cars in the past. Patient reports that she does not shower or complete steps without her husband present for assistance. Patient also reports that her co-worker, Manuela Schwartz, is a big support for her at work as a Radio broadcast assistant. Patient reports that she stopped driving a year ago because "I couldn't see/drive on the left side. My husband had to grab a wheel."    Self-Care   Usual Activity Tolerance moderate   Current Activity Tolerance moderate   Equipment Currently Used at Home no   Pain Assessment   Additional Documentation Pain Scale: Numbers Pre/Post-Treatment (Group)   Pretreatment Pain Rating 1/10   Posttreatment Pain Rating 1/10     Pre/Posttreatment Pain Comment back   Coping/Psychosocial   Observed Emotional State calm;cooperative;pleasant   Verbalized Emotional State acceptance   Coping/Psychosocial Response Interventions   Plan Of Care Reviewed With patient   Supportive Measures active listening utilized;decision-making supported;goal setting facilitated;positive reinforcement provided;problem solving facilitated;self-care encouraged;verbalization of feelings encouraged;self-responsibility promoted   Trust Relationship/Rapport care explained;choices provided;emotional support provided;empathic listening provided;questions encouraged;questions answered;reassurance provided;thoughts/feelings acknowledged   Cognitive Assessment/Interventions   Behavior/Mood Observations alert;cooperative   Orientation Status oriented x 4   Attention WNL/WFL;distractible   Follows Commands WFL   Vision Assessment/Interventions   Visual Impairment/Limitations WFL with corrective lenses;limited persistence with tracking  (LLQ limited persistence with tracking )   RUE Assessment   RUE Assessment WFL- Within Functional Limits   LUE Assessment   LUE Assessment X-Exceptions   LUE ROM AROM WFL    LUE Strength grossy 4+/5   RLE Assessment   RLE Assessment X-Exceptions   RLE Other see PT note for details   LLE Assessment   LLE Assessment X-Exceptions   LLE Other see PT note for details   Mobility Assessment/Training   Additional Documentation Bed Mobility  Assessment/Treatment (Group);Transfer Assessment/Treatment (Group)   Mobility Comment Patient completed functional mobility of ~250 feet with CGA x 1 using FWW initially, then completing mobility with no AD. Patient required CGA for steadying and safety.   Bed Mobility Assessment/Treatment   Bed Mobility, Assistive Device Head of Bed Elevated   Supine-Sit Independence stand-by assistance   Sit to Supine, Independence not tested   Safety Issues decreased use of arms for pushing/pulling;decreased use of legs for  bridging/pushing   Impairments strength decreased;coordination impaired   Transfer Assessment/Treatment   Sit-Stand Independence stand-by assistance;contact guard assist   Stand-Sit Independence contact guard assist   Sit-Stand-Sit, Assist Device walker, front wheeled  (initially with FWW, then progressed to no AD)   Bed-Chair Independence contact guard assist   Bed-Chair-Bed Assist Device side by side   Toilet Transfer Independence not tested   Transfer Safety Issues balance decreased during turns   Transfer Impairments balance impaired;coordination impaired;strength decreased   Bathing Assessment/Training   Comment Anticipate Mod I-Min A based on clinical judgement.   Upper Body Dressing Assessment/Training   Comment Anticipate SUA-independence based on clinical judgement.   Lower Body Dressing Assessment/Training   Position sitting   DRESSING ASSESSED Don Socks   Independence Level  set up required;standby assist   Comment Patient completed LB dressing task to don hospital socks over regular socks with SUA-SBA while seated EOB.   Toileting Assessment/Training   Comment Patient with no toileting needs during evaluation.   Grooming Assessment/Training   Position supported sitting   Independence Level set up required   Comment Patient completed grooming task to wash face with SUA while seated in bedside chair.   Self-Feeding Assessment/Training   Comment Anticipate SUA-Mod I based on clinical judgement.   IADL Evaluation   IADL Comments Anticipate mild-moderate difficulty with IADLs at this time secondary to impaired balance, coordination, and L-sided strength.   Balance Skill Training   Sitting Balance: Static fair + balance   Sitting, Dynamic (Balance) fair + balance   Sit-to-Stand Balance fair balance   Standing Balance: Static fair balance   Standing Balance: Dynamic fair - balance   Post Treatment Status   Post Treatment Patient Status Patient sitting in bedside chair or w/c;Call light within reach;Telephone  within reach;Sitter select activated   Support Present Post Treatment  None   Care Plan Goals   OT Rehab Goals Occupational Therapy Goal;Occupational Therapy Goal 2;Bed Mobility Goal;Grooming Goal;LB Dressing Goal;Toileting Goal;Transfer Training Goal;UB Dressing Goal   Occupational Therapy Goals   OT Goal, Date Established 09/19/19   OT Goal, Time to Achieve by discharge   OT Goal, Activity Type Patient to demonstrate fair balance or greater during all functional transfers/mobility to increase safety when participating in daily activities   Occupational Therapy Goal 2   OT Goal, Date Established 09/19/19   OT Goal, Time to Achieve by discharge   OT Goal, Activity Type Patient to complete functional mobility of household distance with supervision using LRAD vs. no AD   Bed Mobility Goal   Bed Mobility Goal, Date Established 09/19/19   Bed Mobility Goal, Time to Achieve by discharge   Bed Mobility Goal, Activity Type all bed mobility activities   Bed Mobility Goal, Independence Level modified independence   Grooming Goal   Grooming Goal, Date Established 09/19/19   Grooming Goal, Time to Achieve by discharge   Grooming Goal, Activity Type all grooming tasks   Grooming Goal, Independence  independent   LB Dressing Goal   LB Dressing  Goal, Date Established 09/19/19   LB Dressing Goal, Time to Achieve by discharge   LB Dressing Goal, Activity Type all lower body dressing tasks   LB Dressing Goal, Independence Level modified independence   Toileting Goal   Toileting Goal, Date Established 09/19/19   Toileting Goal, Time to Achieve by discharge   Toileting Goal, Activity Type all toileting tasks   Toileting Goal, Independence Level modified independence   Transfer Training Goal   Transfer Training Goal, Date Established 09/19/19   Transfer Training Goal, Time to Achieve by discharge   Transfer Training Goal, Activity Type all transfers   Transfer Training Goal, Independence Level modified independence   Transfer Training  Goal, Assist Device least restrictrictive assistive device   UB Dressing Goal   UB Dressing  Goal, Date Established 09/19/19   UB Dressing Goal, Time to Achieve by discharge   UB Dressing Goal, Activity Type all upper body dressing tasks   UB Dressing Goal, Independence Level independent   Planned Therapy Interventions, OT Eval   Planned Therapy Interventions ADL retraining;IADL retraining;balance training;bed mobility training;cognitive retraining;strengthening;transfer training;motor coordination training   Clinical Impression   Functional Level at Time of Session Ms. Ferencz tolerated OT evaluation well this date. Patient presented with L-sided weakness in LUE and impaired coordination of BLEs. Patient also presented with impaired balance, reports needing spouse to be present for bathing and completing stairs at baseline secondary to fall risk. Patient completed ADLs with SUA-SBA, functional transfers with SBA-CGA, and functional mobility with CGA. At this time, recommend 24/7 assistance for safety and outpatient OT to address cognitive functions and higher level engagement in IADLs and working role that patient finds meaningful.   Patient/Family Goals Statement to feel better   Criteria for Skilled Therapeutic Interventions Met (OT) yes;meets criteria;skilled treatment is necessary   Rehab Potential good, to achieve stated therapy goals   Therapy Frequency 1x/day;minimum of 2x/week   Predicted Duration of Therapy until discharge;until goals are met   Anticipated Equipment Needs at Discharge shower chair   Anticipated Discharge Disposition home with 24/7 assistance;home with outpatient services  (both)   Evaluation Complexity Justification   Occupational Profile Review Expanded review   Performance Deficits 5+ deficits;Attention;Safety awareness;Strength;Coordination;Balance;Pain   Clinical Decision Making Moderate analytic complexity   Evaluation Complexity Moderate       Therapist:   Darcus Pester, OT  Pager #:  [phone]

## 2019-09-19 NOTE — Ancillary Notes (Signed)
Utilization Review Determination    SECTION I  Reason for Physician Advisor Referral: Does not meet inpatient criteria. 09/18/19    Current [facility] MD Order: Inpatient    Utilization Review Findings: Patient meets observation status.  Utilization Review MD: Dr. Hester Mates    Based on clinical information available to me as per above and in chart, the patient's status should be: Observation    -------------------------------------------------------------------------------------------------------------------  1.  I have reviewed this case with the patient's treating physician Ronalee Belts, and the MD agrees with this change in status.  They are aware of the referral to the Utilization Review Committee.    2.  Due to COVID Restrications, the patient has not been notified of the changes in level of care.  CM Savannah Stimmel has been notified by phone on 09/19/2019 of changes in level of care she will relay information to [employer].      (Patient notification is required only if the status is changed while an Inpatient to Observation Status)  -------------------------------------------------------------------------------------------------------------------

## 2019-09-19 NOTE — Progress Notes (Signed)
[facility]  Neurology Progress Note      Jasmine Brown, Jasmine Brown, 43 y.o. female  Date of Admission:  09/18/2019  Date of service: 09/19/2019  Date of Birth:  August 19, 1976      Chief Complaint: Cognitive decline, and other symptoms (left arm jerking, gait imbalance and slurred speech)  Pt's condition today: stable    Subjective: She is reporting this morning episode of left arm and hand jerking for few minutes. No headache. She is communicative and appropriate.     Vital Signs:  Temp (24hrs) Max:37.4 C ([id number] F)      Systolic (123XX123), 123XX123 , Min:87 , [id number]     Diastolic (123XX123), [id number], Min:60, Max:94    Temp  Avg: 37.1 C (98.7 F)  Min: 36.8 C (98.2 F)  Max: 37.4 C (99.4 F)  Pulse  Avg: 83.5  Min: 65  Max: 117  Resp  Avg: 17.3  Min: 16  Max: 18  SpO2  Avg: 98.7 %  Min: 98 %  Max: 100 %  Pain Score (Numeric, Faces): 0    Today's Physical Exam:  [place]:  Eye opening:  4 spontaneous, Verbal response:  5 oriented, Best motor response:  6 obeys commands  Mental status:  Level of Consciousness: alert  Orientations: Alert and oriented x 3  MemoryRegistration, Recall, and Following of commands is normal  AttentionsAttention and Concentration are normal  Knowledge: Good  Language: Normal  Speech: Slurred due to face asymmetry   Cranial nerves:   CN2: Visual acuity and fields intact  CN 3,4,6: EOMI, PERRLA  CN 5Facial sensation intact  CN 7Face asymmetry (non neurologic)  CN 8: Hearing grossly intact  CN 9,10: Palate symmetric and gag normal  CN 11: Sternocleidomastoid and Trapezius have normal strength.  CN 12: Tongue normal with no fasiculations or deviation  Gait, Coordination, and Reflexes:   Gait: Normal, difficulty with tandem gait (factitious appearing)  Coordination: Finger to nose: abnormal left    Muscle tone: WNL  Muscle exam: some give away weakness sporadically, otherwise as below   Arm Right Left Leg Right Left   Deltoid 5/5 5/5 Iliopsoas 5/5 5/5   Biceps 5/5 5/5 Quads 5/5 5/5   Triceps 5/5 5/5  Hamstrings 5/5 5/5   Wrist Extension 5/5 5/5 Ankle Dorsi Flexion 5/5 5/5   Wrist Flexion 5/5 5/5 Ankle Plantar Flexion 5/5 5/5   Interossei 5/5 5/5 Ankle Eversion 5/5 5/5   APB 5/5 5/5 Ankle Inversion 5/5 5/5       Reflexes   RJ BJ TJ KJ AJ Plantars Hoffman's   Right 2+ 2+ 2+ 2+ 2+ Downgoing Not present   Left 2+ 2+ 2+ 2+ 2+ Downgoing Not present     Sensory: Decreased sensation to light touch and vibration in the left side      Current Medications:  acetaminophen (TYLENOL) tablet, 650 mg, Oral, Q4H PRN  DULoxetine (CYMBALTA) delayed release capsule, 30 mg, Oral, NIGHTLY  heparin 5,000 unit/mL injection, 5,000 Units, Subcutaneous, Q8HRS  magnesium oxide (MAG-OX) tablet, 400 mg, Oral, Daily  multivitamin tablet, 1 Tab, Oral, Daily  nicotine (NICODERM CQ) transdermal patch (mg/24 hr), 14 mg, Transdermal, Daily  norethindrone-ethinyl estradiol (NECON 1/35) 1mg -28mcg tab, 1 Tab, Oral, Daily  NS flush syringe, 2-6 mL, Intracatheter, Q8HRS    And  NS flush syringe, 2-6 mL, Intracatheter, Q1 MIN PRN  ondansetron (ZOFRAN) 2 mg/mL injection, 4 mg, Intravenous, Q6H PRN  riboflavin (VITAMIN B-2) tablet, 400 mg, Oral, Daily  sennosides-docusate sodium (SENOKOT-S) 8.6-50mg   per tablet, 1 Tab, Oral, 2x/day PRN  spironolactone (ALDACTONE) tablet, 100 mg, Oral, Daily with Breakfast  topiramate (TOPAMAX) tablet, 300 mg, Oral, NIGHTLY        I/O:  I/O last 24 hours:  No intake or output data in the 24 hours ending 09/19/19 0644  I/O current shift:  No intake/output data recorded.    Labs  Please indicate ordered or reviewed)  Reviewed:   I have reviewed all lab results.  Lab Results Today:    Results for orders placed or performed during the hospital encounter of 09/18/19 (from the past 24 hour(s))   ECG 12-LEAD   Result Value Ref Range    Ventricular rate 76 BPM    Atrial Rate 76 BPM    PR Interval 116 ms    QRS Duration 72 ms    QT Interval 376 ms    QTC Calculation 423 ms    Calculated P Axis 62 degrees    Calculated R Axis 56  degrees    Calculated T Axis 62 degrees   CBC WITH DIFF   Result Value Ref Range    WBC 10.3 3.7 - 11.0 x10^3/uL    RBC 3.73 (L) 3.85 - 5.22 x10^6/uL    HGB 12.8 11.5 - 16.0 g/dL    HCT 36.2 34.8 - 46.0 %    MCV 97.1 78.0 - 100.0 fL    MCH 34.3 (H) 26.0 - 32.0 pg    MCHC 35.4 31.0 - 35.5 g/dL    RDW-CV 13.0 11.5 - 15.5 %    PLATELETS 203 150 - 400 x10^3/uL    MPV 11.1 8.7 - 12.5 fL   MANUAL DIFF AND MORPHOLOGY-SYSMEX   Result Value Ref Range    NEUTROPHIL % 32 %    LYMPHOCYTE %  58 %    MONOCYTE % 6 %    EOSINOPHIL % 3 %    BASOPHIL % 1 %    NEUTROPHIL # 3.30 1.50 - 7.70 x10^3/uL    LYMPHOCYTE # 5.97 (H) 1.00 - 4.80 x10^3/uL    MONOCYTE # 0.62 0.20 - 1.10 x10^3/uL    EOSINOPHIL # 0.31 <=0.50 x10^3/uL    BASOPHIL # 0.10 <=0.20 x10^3/uL    RBC MORPHOLOGY Normal RBC and PLT Morphology        Review of reports and notes reveal:   No new images or recent studies to review at this time.  Independent Interpretation of images or specimens:  No new images or recent studies to review at this time.    Patient/ Family Discussion: Husband updated on admission     Assessment/Plan:  Active Hospital Problems    Diagnosis   . Cognitive decline     43 y,o F with reported history of stroke (no evidence of it in recent MRI), migraine and PNES who comes with multiple symptoms some of which are chronic but include slurred speech, asymmetric face (fluctuates on exam), gait problems, BLE numbness, LUEs abnormal movements, episodes of urinary incontinence and cognitive changes.   Her story is atypical and exam is remarkable for subjective sensory loss of the left side, difficulty with tandem gait and left hand fine motor skills.  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     Variable neurologic complaints most significant for cognitive decline and L hand fine motor  skill abnormality   -  Neuro checks and vitals  - MRI brain w/wo contrast (previous abnormal MRI as above)  - MRI T-C spine w contrast   - Work-up for enhancing lesion with LP and CSF studies including oligoclonal bands and infection panel)  - Discussing plan for vEEG given episode of left arm jerking movement over night!!  -Continue her home Topamax and magnesium  for headaches    Chronic medical conditions  she takes Spironolactone   she takes Duloxetine   she takes multivitamins  she takes OCP       DVT/PE Prophylaxis: Heparin    Nunzio Cory, MD      I saw and examined the patient.  I reviewed the resident's note.  I agree with the findings and plan of care as documented in the resident's note.  Any exceptions/additions are edited/noted. Please refer to addendum to admission H&P as well.    Denyce Robert, MD

## 2019-09-19 NOTE — Nurses Notes (Signed)
During EEG patient had seizure like activity with twitching of her left hand and her left leg plantar-flexed and stayed there. Patient stared blankly forward and took about a minute to come out of it. Patient looked at me afterwards and said "I okay" and then she worked with her left hand to uncramp it and massaged her left foot and lower leg to relax the muscles. Patient states she remembers the episode and could hear me talking but was unable to respond. EEG tech captured episode and this nurse notified service of episode.

## 2019-09-19 NOTE — Nurses Notes (Signed)
EEG completed, patient resting in bed, texting on her phone. No further seizure like activity. Patient just seems tired and states that she is tired. Denies pain. Will continue to monitor.

## 2019-09-19 NOTE — Care Plan (Signed)
Problem: Adult Inpatient Plan of Care  Goal: Plan of Care Review  Outcome: Ongoing (see interventions/notes)  Goal: Patient-Specific Goal (Individualized)  Outcome: Ongoing (see interventions/notes)  Flowsheets (Taken 09/18/2019 2151)  Individualized Care Needs: Allow extra time for response  Anxieties, Fears or Concerns: "i want to know what is causing this"  Patient-Specific Goals (Include Timeframe): Determine what is causing symptoms  Goal: Absence of Hospital-Acquired Illness or Injury  Outcome: Ongoing (see interventions/notes)  Goal: Optimal Comfort and Wellbeing  Outcome: Ongoing (see interventions/notes)  Goal: Rounds/Family Conference  Outcome: Ongoing (see interventions/notes)     Problem: Depression  Goal: Improved Mood  Outcome: Ongoing (see interventions/notes)     Problem: Mobility Impairment  Goal: Optimal Mobility  Outcome: Ongoing (see interventions/notes)     Problem: Fall Injury Risk  Goal: Absence of Fall and Fall-Related Injury  Outcome: Ongoing (see interventions/notes)     Continued on high fall, seizure and aspiration precautions. Patient up x1-2 assist, bed alarm in place. Alert and oriented. Speech is slurred. VSS. Needs MRI/brain spine.

## 2019-09-20 LAB — URINALYSIS, MACROSCOPIC
BILIRUBIN: NEGATIVE mg/dL
BLOOD: NEGATIVE mg/dL
COLOR: NORMAL
GLUCOSE: NEGATIVE mg/dL
KETONES: NEGATIVE mg/dL
NITRITE: POSITIVE — AB
PH: 8 (ref 5.0–8.0)
PROTEIN: NEGATIVE mg/dL
SPECIFIC GRAVITY: 1.011 (ref 1.005–1.030)
UROBILINOGEN: NEGATIVE mg/dL

## 2019-09-20 LAB — URINALYSIS, MICROSCOPIC
RBCS: 1 /HPF (ref <6.0)
WBCS: 5 /HPF (ref <11.0)

## 2019-09-20 LAB — BODY FLUID CSF MAN DIFF

## 2019-09-20 LAB — POC ISTAT CREATININE (RESULT): CREATININE, POC: 0.9 mg/dL (ref 0.49–1.10)

## 2019-09-20 LAB — GLUCOSE CSF: GLUCOSE CSF: 60 mg/dL (ref 50–80)

## 2019-09-20 LAB — BODY FLUID CELL COUNT WITH DIFFERENTIAL
NUCLEATED CELLS, FLUID: 1 /uL (ref 0–5)
RBC COUNT: 0 /uL

## 2019-09-20 LAB — EXTRA RED TOP FOR MS PROFILE

## 2019-09-20 LAB — PROTEIN CSF: PROTEIN CSF: 37 mg/dL (ref 15–45)

## 2019-09-20 LAB — KEEP FROZEN

## 2019-09-20 MED ORDER — NITROFURANTOIN MONOHYDRATE/MACROCRYSTALS 100 MG CAPSULE
100.00 mg | ORAL_CAPSULE | Freq: Two times a day (BID) | ORAL | 0 refills | Status: AC
Start: 2019-09-20 — End: 2019-09-25

## 2019-09-20 MED ORDER — GADOBUTROL 1 MMOL/ML (604.72 MG/ML) INTRAVENOUS SOLUTION
6.0000 mL | INTRAVENOUS | Status: AC
Start: 2019-09-20 — End: 2019-09-20
  Administered 2019-09-20: 01:00:00 6 mL via INTRAVENOUS

## 2019-09-20 NOTE — Discharge Summary (Signed)
[facility]  DISCHARGE SUMMARY    PATIENT NAME:  Jasmine, Brown  MRN:  [id number]  DOB:  24-May-1976    ENCOUNTER DATE:  09/18/2019  INPATIENT ADMISSION DATE: 09/18/2019  DISCHARGE DATE:  09/19/2019    ATTENDING PHYSICIAN: Denyce Robert, MD  SERVICE: NEUROLOGY 1  PRIMARY CARE PHYSICIAN: Zadie Rhine, FNP         LAY CAREGIVER:  ,  ,        PRIMARY DISCHARGE DIAGNOSIS:   Active Hospital Problems    Diagnosis Date Noted   . Cognitive decline [R41.89] 09/18/2019      Resolved Hospital Problems   No resolved problems to display.     Active Non-Hospital Problems    Diagnosis Date Noted   . Slurred speech 09/18/2019   . Tobacco use disorder 08/17/2019   . Seizure-like activity (CMS HCC) 08/15/2019   . 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 Benzodiazepine dependence (CMS [place]) 01/10/2018   . Post herpetic neuralgia 01/09/2018   . CVA (cerebral vascular accident) (CMS [place]) 01/09/2018   . Migraines 01/09/2018   . Hydrocephalus (CMS HCC) 01/09/2018        DISCHARGE MEDICATIONS:     Current Discharge Medication List      START taking these medications.      Details   nitrofurantoin 100 mg Capsule  Commonly known as:  Macrobid   100 mg, Oral, 2 TIMES DAILY  Qty:  10 Cap  Refills:  0        CONTINUE these medications - NO CHANGES were made during your visit.      Details   ALPRAZolam 1 mg Tablet  Commonly known as:  XANAX   1 mg, Oral, 2 TIMES DAILY PRN  Refills:  0     DULoxetine 30 mg Capsule, Delayed Release(E.C.)  Commonly known as:  CYMBALTA DR   TK 1 C PO QD  Refills:  0     estradioL 0.01 % (0.1 mg/gram) Cream  Commonly known as:  ESTRACE   2 g, Vaginal  Refills:  0     magnesium oxide 400 mg Tablet   400 mg, Oral, DAILY  Refills:  0     MULTIPLE VITAMINS ORAL   Oral, DAILY  Refills:  0     norethindrone-ethinyl estradiol 1-35 mg-mcg/tab Tablet  Commonly known as:  equiv to: ORTHO NOVUM 1-35   1 Tab, Oral, DAILY  Refills:  0     PROBIOTIC-10 ORAL   Oral  Refills:  0     riboflavin (vitamin B2) 400 mg Tablet   400 mg, Oral,  DAILY  Refills:  0     spironolactone 100 mg Tablet  Commonly known as:  ALDACTONE   100 mg, Oral, EVERY MORNING WITH BREAKFAST  Refills:  0     topiramate 100 mg Tablet  Commonly known as:  TOPAMAX   3 qhs  Qty:  90 Tab  Refills:  11     VITAMIN B12 ORAL   Oral  Refills:  0          Discharge med list refreshed?  YES                     ALLERGIES:  Allergies   Allergen Reactions   . Aimovig Autoinjector [Erenumab-Aooe]    . Bactrim [Sulfamethoxazole-Trimethoprim]    . Codeine    . Furosemide    . Hctz [Hydrochlorothiazide]    . Maxidex [Dexamethasone]    . Sulfa (  Sulfonamides)    . Triamterene    . Trimethoprim              HOSPITAL PROCEDURE(S):   Bedside Procedures:  Orders Placed This Encounter   Procedures   . BEDSIDE  LUMBAR PUNCTURE     Surgical     REASON FOR HOSPITALIZATION AND HOSPITAL COURSE     BRIEF HPI: Jasmine Brown is a 43 y.o., White female with PMH of migraine, PNES, stroke vs. Bell's palsy (age 70 years, symptom was left droopy face, LUE was drawn to her body. She was told etiology was allergy to sulfa. She did rehab for 1 year and came back to completely normal), who presents with 2-3 weeks history of sudden onset gait imbalance and falls when she woke-up one day, since then she has been stumbling and have left sided gait weakness where she feels she needs to hold on something on the right side. She also reports 9 months history of remarkable cognitive decline, difficulty functioning at work, shopping and sometimes forgetting 'who she is being married to'. 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 More recently the patient has been having facial drawing and speech problem for two weeks.     BRIEF HOSPITAL NARRATIVE: Patient was admitted to the floor, was full code, and placed on aspiration and seizure precautions. She was monitored with vitals, neurology checks, and pulse oximetry every four hours, and given heparin for DVT prevention. Patient physical exam  showed facial asymmetry, but bilateral, equally engaged platysma. Coordination with finger to nose showed wavering, but she did meet nose. Patient did drift without pronation. Patient also went MRI Brain w/wo, MRI of cervical spine, and MRI of thoracic spine which showed:    MRI Brain w/wo contrast (09/20/2019): There has been resolution of the punctate enhancement in the right frontal lobe seen on the previous study with residual T2 hyperintensity in this region. There is interval development of a new punctate focus in the right middle frontal gyrus white matter. There has also been interval development of a tiny focus of T2 hyperintensity in the left frontal lobe with otherwise unchanged few punctate foci of subcortical T2 hyperintensity in the frontal lobes. The findings may suggest a demyelinating process.    MRI Cervical Spine (09/20/2019): Unremarkable MRI examination of the cervical spine.    MRI Thoracic Spine (09/20/2019): No cord signal abnormality is identified. 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    Patient underwent routine EEG during which patient did have an episode. 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 These findings are similar to continuous video EEG findings from 08/17/2019, during which the patient had one episode with asynchronous shaking and unresponsiveness, with no EEG correlate.     Patient also had a lumbar puncture with opening pressure of 17 mm H2O. Viral studies and multiple sclerosis profile pending on discharge. Of note, after spinal tap was completed, patient states her voice and face droop completely resolved. States she is speaking normally now and feels like her face is back to normal.     Patient was determined to be appropriate for discharge with home assistance. PT/OT recommended outpatient services. Patient was found to have a UTI and was prescribed a 5-day course of Macrobid  100mg  on discharge.     TRANSITION/POST DISCHARGE CARE/PENDING TESTS/REFERRALS: Lumbar puncture performed, results pending.   - Follow up with  Dr. Stoney Bang in 3 months    CONDITION ON DISCHARGE:  A. Ambulation: Ambulation with assistive device  B. Self-care Ability: With partial assistance  C. Cognitive Status Alert and Oriented x 3  D. Code status at discharge:   Code Status Information     Code Status    Full Code                 LINES/DRAINS/WOUNDS AT DISCHARGE:   Patient Lines/Drains/Airways Status    Active Line / Dialysis Catheter / Dialysis Graft / Drain / Airway / Wound     Name: Placement date: Placement time: Site: Days:    Peripheral IV Left Median Cubital  (antecubital fossa)  09/18/19   2314   1                DISCHARGE DISPOSITION:  Home discharge      NEUROLOGY RISK FACTORS:  -None of the following conditions apply        DISCHARGE INSTRUCTIONS:  Follow-up Information     [facility], [place] .    Specialty:  Neurology  Contact information:  [place] [phone]  [phone]  Additional information:  [facility] are currently suspended due to COVID-19 restrictions at this [place] outpatient clinic. We apologize for any inconvenience this may cause. Please ask an attendant for assistance if needed.                  OCCUPATIONAL THERAPY OUTPATIENT EXTERNAL REFERRAL     Outpt OT Indications: WEAKNESS      PHYSICAL THERAPY OUTPATIENT EXTERNAL REFERRAL     PT need to be addressed: WEAKNESS      FOLLOW-UP: NEUROLOGY - [place] [facility] [place] [place]     Follow-up in: 3 MONTHS    Reason for visit: HOSPITAL DISCHARGE    Follow-up reason: abnormal MRI    Provider: only Camila Escobar      DME,OTHER (REQUISITION/REQUEST)    SHOWER CHAIR     Ht 149.9 cm    Wt 61.3 kg    Current Attending: Denyce Robert    Medical Condition or Diagnosis which is primary reason for equipment: Cognitive decline    Start  Date 09/19/2019    Estimated Length of Need (in months; 99 mo.= lifetime) 99                 [place], DO    Copies sent to Care Team       Relationship Specialty Notifications Start End    Zadie Rhine, [place] PCP - General EXTERNAL  09/19/19     Phone: [phone] Fax: [phone]         [address] [place] [address]    Orpah Cobb F, MD  EXTERNAL  01/09/18     Phone: [phone] Fax: [phone]         [address] [place] [address]            Referring providers can utilize [email or web address] to access their referred [place] patient's information.                I saw and examined the patient.  I reviewed the resident's note.  I agree with the findings and plan of care as documented in the resident's note.  Any exceptions/additions are edited/noted.    Denyce Robert, MD

## 2019-09-20 NOTE — Progress Notes (Signed)
Interval Update:    After spinal tap, went to speak with patient to update her about antibiotic for UTI. Patient was sitting up and said "look at my face and hear my voice, I'm completely back to normal." Patient states she felt this way immediately after the spinal tap was completed, she states her voice immediately went back to normal as well as her face droop. Prelim spinal tap results normal. Uncertain what to make of this resolution of symptoms, likely non-neurologic given prior exams.     Yvonna Alanis, MD 09/20/2019 12:58  PGY-4 Neurology  Pager [phone]      I saw and examined the patient.  I reviewed the resident's note.  I agree with the findings and plan of care as documented in the resident's note.  Any exceptions/additions are edited/noted.    I also examined patient after spinal tap. Her facial weakness and arm drift had completely resolved. She was speaking normally. She explained she had a similar improvement after a lumbar puncture in the past and was symptom free for six months.    Denyce Robert, MD

## 2019-09-20 NOTE — Nurses Notes (Signed)
PIV removed, AVS reviewed with pt. Questions encouraged. Pt being d/c home via private vehicle. Husband at bedside to drive pt home.

## 2019-09-20 NOTE — Ancillary Notes (Signed)
[place] [facility]  MRI Technologist Note        MRI has been completed.        Juluis Pitch, [facility] 09/20/2019, 00:30

## 2019-09-20 NOTE — Procedures (Signed)
Diagnostic Lumbar Puncture      Procedure Date:  09/20/2019  Time:  1100  Procedure: Lumbar Puncture  Diagnosis:  Cognitive decline  Indication:  Diagnostic Lumbar Puncture    Description: After informed consent a surgical time out was performed to confirm correct patient and procedure. Patient was placed inleft decubitus position and hyperflexion of the spine (semi fetal position) was performed. TheL2-3 intervertebral space was identified by palpation and marked.  The skin was prepped with betadine and draped in usual sterile fashion. Afterwards the area was infiltrated with 1%. A 22 gauge LP needle was advanced into the spinal canal until CSF return was observed. The fluid was clear and the opening pressure was 17 mm of H20. A total of 18 mL CSF was collected and sent to the lab for pertinent tests and analysis. Patient tolerated procedure without complications.       Yvonna Alanis, MD 09/20/2019, 11:32      I saw and examined the patient.  I reviewed the resident's note.  I agree with the findings and plan of care as documented in the resident's note.  Any exceptions/additions are edited/noted. Please see separate addendum regarding dramatic improvement in symptoms after lumbar puncture including complete resolution of left lower facial weakness, left arm drift, and return of speech to normal.    Denyce Robert, MD

## 2019-09-20 NOTE — Progress Notes (Signed)
[facility]  Neurology Progress Note      Jasmine Brown, Jasmine Brown, 43 y.o. female  Date of Admission:  09/18/2019  Date of service: 09/20/2019  Date of Birth:  1976-02-22      Chief Complaint: slurred speech, cognitive decline  Pt's condition today: stable    Subjective:   No acute events overnight. Patient did have episode of twitching during EEG. Patient was tired during visit.     Vital Signs:  Temp (24hrs) Max:36.9 C (99991111 F)      Systolic (123XX123), 0000000 , Min:80 , [id number]     Diastolic (123XX123), 99991111, Min:50, Max:80    Temp  Avg: 36.7 C (98 F)  Min: 36.5 C (97.7 F)  Max: 36.9 C (98.4 F)  Pulse  Avg: 65.7  Min: 63  Max: 68  Resp  Avg: 17.2  Min: 16  Max: 18  SpO2  Avg: 96.5 %  Min: 95 %  Max: 97 %  Pain Score (Numeric, Faces): 0    Today's Physical Exam:  General:alert  Mental status:Alert and oriented x 3  Memory: Registration, Recall, and Following of commands is normal  Attention: Attention and Concentration are normal  Knowledge: Good  Language and Speech: Normal and Slurred  Cranial nerves: Cranial nerves 2-12 are normal except: CN7 facial asymmetry (non neurologic), can engage platysma equally bilaterally   Muscle tone: WNL  Motor strength:  Motor strength is normal throughout.  Sensory: Sensory exam in the upper and lower extremities is normal except as noted:   Gait: difficulty with tandem gait (appears ficticious)   Coordination: Coordination is normal and Finger to nose: does waver through exam, but is able to meet nose.  Drift present without pronation.   Reflexes: Reflexes are 2/2 throughout    EEG 10/23: Report pending. Initial read is that episode during EEG was likely non-epileptic.     Current Medications:  acetaminophen (TYLENOL) tablet, 650 mg, Oral, Q4H PRN  ALPRAZolam (XANAX) tablet, 1 mg, Oral, 2x/day PRN  DULoxetine (CYMBALTA) delayed release capsule, 30 mg, Oral, NIGHTLY  heparin 5,000 unit/mL injection, 5,000 Units, Subcutaneous, Q8HRS  magnesium oxide (MAG-OX) tablet, 400 mg,  Oral, Daily  multivitamin tablet, 1 Tab, Oral, Daily  nicotine (NICODERM CQ) transdermal patch (mg/24 hr), 14 mg, Transdermal, Daily  NS flush syringe, 2-6 mL, Intracatheter, Q8HRS    And  NS flush syringe, 2-6 mL, Intracatheter, Q1 MIN PRN  ondansetron (ZOFRAN) 2 mg/mL injection, 4 mg, Intravenous, Q6H PRN  oral contraceptive-patient's own supply, 1 Tab, Oral, Daily  riboflavin (VITAMIN B-2) tablet, 400 mg, Oral, Daily  sennosides-docusate sodium (SENOKOT-S) 8.6-50mg  per tablet, 1 Tab, Oral, 2x/day PRN  spironolactone (ALDACTONE) tablet, 100 mg, Oral, Daily with Breakfast  topiramate (TOPAMAX) tablet, 300 mg, Oral, NIGHTLY        I/O:  I/O last 24 hours:      Intake/Output Summary (Last 24 hours) at 09/20/2019 0818  Last data filed at 09/20/2019 0600  Gross per 24 hour   Intake 840 ml   Output 400 ml   Net 440 ml     I/O current shift:  No intake/output data recorded.    Labs  Please indicate ordered or reviewed)  Results for orders placed or performed during the hospital encounter of 09/18/19 (from the past 24 hour(s))   URINALYSIS, MACROSCOPIC AND MICROSCOPIC W/CULTURE REFLEX    Specimen: Urine, Clean Catch    Narrative    The following orders were created for panel order URINALYSIS, MACROSCOPIC AND MICROSCOPIC W/CULTURE  REFLEX.  Procedure                               Abnormality         Status                     ---------                               -----------         ------                     URINALYSIS, MACROSCOPIC[[id number]]      Abnormal            Final result               URINALYSIS, MICROSCOPIC[[id number]]      Abnormal            Final result                 Please view results for these tests on the individual orders.   URINALYSIS, MACROSCOPIC   Result Value Ref Range    SPECIFIC GRAVITY 1.011 1.005 - 1.030    GLUCOSE Negative Negative mg/dL    PROTEIN Negative Negative mg/dL    BILIRUBIN Negative Negative mg/dL    UROBILINOGEN Negative Negative mg/dL    PH 8.0 5.0 - 8.0    BLOOD Negative Negative  mg/dL    KETONES Negative Negative mg/dL    NITRITE Positive (A) Negative    LEUKOCYTES Small (A) Negative WBCs/uL    APPEARANCE Cloudy (A) Clear    COLOR Normal (Yellow) Normal (Yellow)   URINALYSIS, MICROSCOPIC   Result Value Ref Range    WBCS 5.0 <11.0 /hpf    RBCS 1.0 <6.0 /hpf    BACTERIA Many (A) Occasional or less /hpf    AMORPHOUS SEDIMENT Light Light /hpf    SQUAMOUS EPITHELIAL CELLS Few (A) Occasional or less /lpf   C-REACTIVE PROTEIN(CRP),INFLAMMATION   Result Value Ref Range    CRP INFLAMMATION 1.1 <=8.0 mg/L   POC ISTAT CREATININE (RESULT)   Result Value Ref Range    CREATININE, POC 0.90 0.49 - 1.10 mg/dL         Review of reports and notes reveal:     Independent Interpretation of images or specimens:  1. MRI of the Brain performed on 09/20/2019 on [place] PACS and it shows IMPRESSION:   There has been resolution of the punctate enhancement in the right frontal  lobe seen on the previous study with residual T2 hyperintensity in this  region. There is interval development of a new punctate focus of  enhancement in the right middle frontal gyrus white matter. There has also  been interval development of a tiny focus of T2 hyperintensity in the left  frontal lobe with otherwise unchanged few punctate foci of subcortical T2  hyperintensity in the frontal lobes.    The findings may suggest a demyelinating process.    2. MRI of the Thoracic Spine performed on 09/20/2019 on [place] and it shows IMPRESSION:   No cord signal abnormality is identified. 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     3. MRI of the Cervical Spine performed on 09/20/2019 on [place] and it shows IMPRESSION:   Unremarkable MRI examination of the cervical spine.  Patient/ Family Discussion: none    Assessment/Plan:  Active Hospital Problems    Diagnosis   . Cognitive decline   26 y,o F with reported history of stroke (no evidence of it in recent MRI), migraine and PNES who comes with multiple symptoms some of which are chronic  but include slurred speech, asymmetric face (fluctuates on exam), gait problems, BLE numbness, LUEs abnormal movements, episodes of urinary incontinence and cognitive changes.   Her story is atypical and exam is remarkable for subjective sensory loss of the left side, difficulty with tandem gait and left hand fine motor skills. 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     Variable neurologic complaints most significant for cognitive decline and L hand fine motor skill abnormality   - direct admission to neurology service with neuro checks and vitals  - MRI brain w/wo contrast done, documented above.  - MRI T-C spine w contrast done, documented above.  - Work-up for enhancing lesion with LP and CSF studies including oligoclonal bands and infection panel)  - Will consider EEG if patient has any of the LUE spells  -Continue her home Topamax and magnesium  for headaches    Labs/Diagnostic studies ordered: LP    DVT/PE Prophylaxis: Heparin    Meenal Balakrishnan, DO      I saw and examined the patient.  I reviewed the resident's note.  I agree with the findings and plan of care as documented in the resident's note.  Any exceptions/additions are edited/noted.    MRI of the thoracic and cervical spine stable. MRI brain demonstrates resolution of prior enhancing right frontal lesion and development of a new left frontal punctate hyperintensity. Left upper extremity spell was captured on vEEG and was nonepileptic. We'll perform a lumbar puncture today. Plan for discharge and clinic follow up after the procedure.    Denyce Robert, MD

## 2019-09-20 NOTE — Care Plan (Signed)
Problem: Adult Inpatient Plan of Care  Goal: Plan of Care Review  Outcome: Adequate for Discharge  Goal: Patient-Specific Goal (Individualized)  Outcome: Adequate for Discharge  Goal: Absence of Hospital-Acquired Illness or Injury  Outcome: Adequate for Discharge  Goal: Optimal Comfort and Wellbeing  Outcome: Adequate for Discharge  Goal: Rounds/Family Conference  Outcome: Adequate for Discharge     Problem: Depression  Goal: Improved Mood  Outcome: Adequate for Discharge     Problem: Mobility Impairment  Goal: Optimal Mobility  Outcome: Adequate for Discharge     Problem: Fall Injury Risk  Goal: Absence of Fall and Fall-Related Injury  Outcome: Adequate for Discharge

## 2019-09-20 NOTE — Care Plan (Signed)
Plan of care reviewed, assessment per doc flow. Pt alert and oriented x4, has been calm and cooperative with care this shift. No complaints of pain. Needs met with hourly rounding. Pt able to sleep between care. Bed in lowest position with wheels locked. Pt on bed alarm for safety and fall prevention. Pt free from falls this shift. Pt resting at this time with call bell in reach. No other concerns at this time.    Problem: Adult Inpatient Plan of Care  Goal: Plan of Care Review  Outcome: Ongoing (see interventions/notes)  Goal: Patient-Specific Goal (Individualized)  Outcome: Ongoing (see interventions/notes)  Goal: Absence of Hospital-Acquired Illness or Injury  Outcome: Ongoing (see interventions/notes)  Goal: Optimal Comfort and Wellbeing  Outcome: Ongoing (see interventions/notes)     Problem: Depression  Goal: Improved Mood  Outcome: Ongoing (see interventions/notes)     Problem: Mobility Impairment  Goal: Optimal Mobility  Outcome: Ongoing (see interventions/notes)     Problem: Fall Injury Risk  Goal: Absence of Fall and Fall-Related Injury  Outcome: Ongoing (see interventions/notes)

## 2019-09-20 NOTE — Nurses Notes (Signed)
Following page sent to service:  [phone] [place]: pt asking if she can return to work on Monday and if not she needs a work excuse.  Thank you.

## 2019-09-21 LAB — HERPES SIMPLEX VIRUS (HSV1/HSV2), PCR, CSF OR SWAB
HERPES SIMPLEX VIRUS 1: NEGATIVE
HERPES SIMPLEX VIRUS 2: NEGATIVE

## 2019-09-21 LAB — URINE CULTURE: URINE CULTURE: >100000 — AB

## 2019-09-21 LAB — VARICELLA ZOSTER VIRUS (VZV), PCR, CSF: VARICELLA ZOSTER VIRUS (VZV) PCR: NEGATIVE

## 2019-09-22 LAB — IMMUNOGLOBULIN G (IGG), SERUM: IMMUNOGLOBULIN G (IGG): 919 mg/dL (ref 610–1616)

## 2019-09-23 LAB — LYME ANTIBODY PANEL WITH REFLEX
LYME IGG: NEGATIVE
LYME IGM: NEGATIVE

## 2019-09-23 LAB — CYTOMEGALOVIRUS (CMV), QUALITATIVE PCR, CSF, URINE: CMV QUALITATIVE PCR: NEGATIVE

## 2019-09-23 LAB — VDRL, SPINAL FLUID: VDRL, SPINAL FLUID: NEGATIVE

## 2019-09-23 NOTE — Procedures (Signed)
NAME:  [place] NUMBER:  [id number]  DATE OF SERVICE:  09/19/2019  DOB:  1976/02/09  SEX:  F      Date of Study: September 19, 2019.   Status: This is an inpatient study.   Technician: CR.   EEG #: [id number].    INTERPRETATION:  This is a normal awake and asleep EEG.    HISTORY:  This is a 43 year old female with a clinical concern for seizures.  This study was requested to evaluate for interictal abnormalities.    REPORT:  This is a digitally acquired video EEG done using the standard 10-20 system of electrode placement.  This EEG runs for 45 minutes during which time both wake and sleep states are recorded.  There is a normal background frequency of approximately 8-9 Hz over the posterior head regions.  Photic stimulation was performed and did not elicit any abnormalities.    CLINICAL CORRELATION:  The EEG described above is within normal limits.  However, a normal EEG does not rule out the possibility of seizure disorder and clinical correlation is recommended.        Janyce Llanos, MD  Assistant Professor   [place] Department of Neurology          DD:  09/22/2019 11:11:02  DT:  09/23/2019 00:26:39 [place]  D#:  [id number]

## 2019-09-24 LAB — MAIN MS PROFILE CSF: KAPPA FREE LIGHT CHAIN, CSF: 0.316 mg/dL — ABNORMAL HIGH (ref <0.1000)

## 2019-09-25 LAB — SERUM BANDS: SERUM BANDS: 0 bands

## 2019-09-25 LAB — CSF CULTURE WITH GRAM STAIN
CSF CULTURE: NO GROWTH
GRAM STAIN: NONE SEEN

## 2019-09-25 LAB — CSF BANDS
CSF BANDS: 0 bands
CSF OLIG BANDS INTERPRETATION: 0 bands (ref <2)

## 2019-09-26 ENCOUNTER — Encounter (INDEPENDENT_AMBULATORY_CARE_PROVIDER_SITE_OTHER): Payer: Self-pay | Admitting: Student in an Organized Health Care Education/Training Program

## 2019-09-27 LAB — ANGIOTENSIN CONVERTING ENZYME (ACE), CSF: ANGIOTENSIN CONVERTING ENZYME, CSF: 1.4 U/L (ref 0.0–2.5)

## 2019-09-29 ENCOUNTER — Ambulatory Visit (INDEPENDENT_AMBULATORY_CARE_PROVIDER_SITE_OTHER): Payer: Self-pay | Admitting: Student in an Organized Health Care Education/Training Program

## 2019-09-29 ENCOUNTER — Telehealth (INDEPENDENT_AMBULATORY_CARE_PROVIDER_SITE_OTHER): Payer: Self-pay | Admitting: Student in an Organized Health Care Education/Training Program

## 2019-09-29 NOTE — Telephone Encounter (Signed)
Regarding: Jasmine Brown - lab results  ----- Message from Roland Earl sent at 09/26/2019  5:00 PM EDT -----  Jasmine Brown  Calling for lab results.  Not all in yet.  Thanks,

## 2019-09-29 NOTE — Telephone Encounter (Signed)
talked to patient, she continues to complain of cognitive symptoms like not remembering who her husband his and other memory problems that I had difficulty understanding. I think she will benefit from neuropsych testing to help [place] elucidate if she has any actual deficits as she tends to embelish symptoms.  Will see her next week to discuss results from admission with Dr [place]   Explained from my perspective she can go to work and is up to her is she feels up for it or not, she says she is not    Jasmine Brown   PGY 3 Neurology resident - [phone]  09/29/19 11:31

## 2019-09-29 NOTE — Telephone Encounter (Signed)
-----   Message from Flossie Buffy sent at 09/26/2019  3:48 PM EDT -----  Regarding: Visit Follow-Up Question  Contact: [phone]  Do we know anything yet? My boss isn't wanting me to come back to work until we know something. After the week, I've had. I'm kinda agreeing with him.     Thank you for everything,     [place]  They told me upon release you would be following up with me.

## 2019-09-29 NOTE — Telephone Encounter (Signed)
Please advise looks like there is one still pending

## 2019-09-29 NOTE — Telephone Encounter (Signed)
Lilli Light, Verdis Frederickson, MD  [place], [place]    Caller: Unspecified (Today, 7:30 AM)              Dory Larsen can we please add her on for 11/12 at 12:30?   I'll call her later but there is no indication from my perspective for her not to work, it is up to her

## 2019-10-08 LAB — MYELIN BASIC PROTEIN CSF: MYELIN BASIC PROTEIN CSF: <2 ug/L (ref 2.0–4.0)

## 2019-10-09 ENCOUNTER — Encounter (INDEPENDENT_AMBULATORY_CARE_PROVIDER_SITE_OTHER): Payer: Self-pay | Admitting: Student in an Organized Health Care Education/Training Program

## 2019-10-09 ENCOUNTER — Ambulatory Visit (INDEPENDENT_AMBULATORY_CARE_PROVIDER_SITE_OTHER): Payer: BC Managed Care – PPO | Admitting: Rheumatology

## 2019-10-09 ENCOUNTER — Other Ambulatory Visit: Payer: Self-pay

## 2019-10-09 ENCOUNTER — Ambulatory Visit
Payer: BC Managed Care – PPO | Attending: Neurology | Admitting: Student in an Organized Health Care Education/Training Program

## 2019-10-09 VITALS — BP 112/69 | HR 99 | Temp 98.1°F | Ht 59.02 in | Wt 132.5 lb

## 2019-10-09 DIAGNOSIS — R269 Unspecified abnormalities of gait and mobility: Secondary | ICD-10-CM

## 2019-10-09 DIAGNOSIS — R4781 Slurred speech: Secondary | ICD-10-CM | POA: Insufficient documentation

## 2019-10-09 DIAGNOSIS — R262 Difficulty in walking, not elsewhere classified: Secondary | ICD-10-CM | POA: Insufficient documentation

## 2019-10-09 DIAGNOSIS — R4189 Other symptoms and signs involving cognitive functions and awareness: Secondary | ICD-10-CM

## 2019-10-09 LAB — VITAMIN B12: VITAMIN B 12: >2000 pg/mL — ABNORMAL HIGH (ref 200–1000)

## 2019-10-09 NOTE — Progress Notes (Signed)
[facility]  Operated by [facility]  Return Outpatient Note      Date:  10/09/2019  Patient Name: Jasmine Brown  MRN: [id number]  Age:  43 y.o.  Referring Physician:   No referring provider defined for this encounter.    CC: Slurred Speech and Gait Problem      History obtained from:  Patient and Family    HPI: Since discharge patient reportED She was still havign severe cognitive changes that made it impossible to work, explained from neurology perspective there is no restriction, she however would benefit from neuropsych testing.   Since discharge thinking is still not right not yet back to work and filed for disability, still having trouble walking due to balance comes today using a cane, she now says she has a new tremor in her right hand that is random.   Bladder issue is "getting out of control" now having incontinence 5 to 6 times per day, states she does not feel like she wants to pee, it just happens only she is standing up, says Gyn checked bladder and is not prolapsed.  States insurance denied her PT/OT    Per my HPI and D/c summary 09/20/19  BRIEF HPI: 43 y.o F with PMH of stroke (? symptom was left droopy face, LUE was drawn to her body. age 4. told etiology was allergy to sulfa. says she did rehab for 1 year and came back to completely normal), migraine who follows with headache clinic usually but today was placed on general clinic due to multiple new symptoms.    Her and husband report:  -Abnormal speech for 2 weeks, she woke up with it and it has been same since then, her face is droopy on the left and has been the same, no difficulty closing your eye.   she would have episodes of facial asymmetry on episodes since she was 30, it has never lasted more than a day, she did not have trouble speaking.  -gait is abnormal because she cannot feel her legs, is numb for 5-6 years up to knees, now numb on the thighs for 2-3 weeks.   -LUE has had abnormal movements that she  describes as dancing movements, going on for about 2 weeks, it has happened 3 times, lasted about 1.5 min, cannot make it stop, at the end of one of those her hand locked down for about 20 min and she also had pain. no abnormal movements in her legs.   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  stopped working 1 week ago.   -incontinences for a couple of months, does not happen when coughing or jumping, she could not tell that she had to pee on any of the episodes, this happened about 5 or 6 times.  Of note per chart review she was complaining of incontinence back in 2017 as well   -not driving because "can't do anything right with his left side" quit driving 6 months ago.   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 She was monitored with vitals, neurology checks, and pulse oximetry every four hours, and given heparin for DVT prevention. Patient physical exam showed facial asymmetry, but bilateral, equally engaged platysma. Coordination with finger to nose showed wavering, but she did meet nose. Patient did drift without pronation. Patient also went MRI Brain w/wo, MRI of cervical spine, and MRI of thoracic spine which showed: normal T, C spine. Brain with resolution of prior frontal lesion but 2 new punctate WM lesions    Patient underwent routine EEG during which  patient did have an episode that started with the LUE abnormalities. 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 These findings are similar to continuous video EEG findings from 08/17/2019, during which the patient had one episode with asynchronous shaking and unresponsiveness, with no EEG correlate.     Patient also had a lumbar puncture with opening pressure of 17 mm H2O. Viral studies and multiple sclerosis profile pending on discharge. Of note, after spinal tap was completed, patient states her voice and face droop completely resolved. States she is speaking normally now and feels like her face is back to normal.     Patient was determined to be appropriate for discharge with home assistance. PT/OT recommended outpatient services.     PHYSICALEXAM:    BP 112/69   Pulse 99   Temp 36.7 C (98.1 F)   Ht 1.499 m (4' 11.02")   Wt 60.1 kg (132 lb 7.9 oz)   SpO2 98%   BMI 26.75 kg/m     Appearance:No Acute Distress  Ophthalmoscopic: Disc Flat, Normal fundus  Carotid/Heart/Peripheral Vascular: No Bruits, RRR  Orientation: Awake, Alert, and Oriented x 3  Mental status:  Memory: Registation 3/3 Recall 3/3  Attention: Normal  Knowledge: Appropriate  Language: No aphasia  Speech: normal  Cranial Nerves:  2 No Visual Defect on Confrontation; Pupils round, equal, reactive tolight  3,4,6 Extraocular Movements Intact; no nystagmus  5 Facial Sensation Intact  7 mild nasolabial flattening, no forehead involvement  8 Intact hearing  9,10 Palate symmetric  11 Good shoulder shrug  12 Tongue Midline  Gait: base seems wider today, no ataxia, uses cane, hesitates and stumps on the floor multiple times with either leg before taking the step (these movements are not economic), unable to turn today without holding into something and even then takes a long time to do so,  cannot  perform tandem.  Coordination: No ataxia with finger to nose testing but  breaks up the movements in BUEs and odes it very slowly, breaks the movement with frequent pauses L>R, left tapping is slow but wide and at times reduced amplitude    Sensory: reduced to pinprick and vibration in all extremities and now says she does not feel anything in her face   Muscle Tone: Normal  Muscle exam despite giveaway moments (worse in BLEs proximally) she is actually full strength, no drift noted today  Arm Right Left Leg Right Left   Deltoid 5/5 5/5 Iliopsoas 5/5 5/5   Biceps 5/5 5/5 Quads 5/5 5/5   Triceps 5/5 5/5 Hamstrings 5/5 5/5   Wrist Extension 5/5 5/5 Ankle Dorsi Flexion 5/5 5/5   Wrist Flexion 5/5 5/5 Ankle  Plantar Flexion 5/5 5/5   Interossei 5/5 5/5 Ankle Eversion 5/5 5/5   APB 5/5 5/5 Ankle Inversion 5/5 5/5       Reflexes   RJ BJ TJ KJ AJ Plantars Hoffman's   Right 2+ 2+ 2+ 2+ 2+ Downgoing Not present   Left 2+ 2+ 2+ 2+ 2+ Downgoing Not present     Personal review of             Diabetes Monitors  A1C - Glucose - Lipids Microalbumin   No results for input(s): HA1C, GLUCOSEFAST, CHOLESTEROL, HDLCHOL, LDLCHOL, LDLCHOLDIR, TRIG in the last 13140 hours. No results for input(s): MICALBRNUR, MICALBCRERAT in the last 13140 hours.     Diabetic foot exam:  No edema bilaterally.                       Outside records:     LP 10/24: OP 17. nucleated 1, RBC 0, Glu 60, Prot 37, MBP <2, VDRL -, CMV -, HSV 1/2 -, VZV -, ACE -, Lyme-,Serum bands -, CSF bands -, CSF OGB 0,  Kappa free light chain 0.3160 (n: <0.1000)    MRI Brain w/wo contrast (09/20/2019): There has been resolution of the punctate enhancement in the right frontal lobe seen on the previous study with residual T2 hyperintensity in this region. There is interval development of a new punctate focus in the right middle frontal gyrus white matter. There has also been interval development of a tiny focus of T2 hyperintensity in the left frontal lobe with otherwise unchanged few punctate foci of subcortical T2 hyperintensity in the frontal  lobes. The findings may suggest a demyelinating process.    MRI Cervical Spine (09/20/2019): Unremarkable MRI examination of the cervical spine.    MRI Thoracic Spine (09/20/2019): No cord signal abnormality is identified. 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    MRI brain w/wo 08/17/19: Small subcentimeter focus of enhancement and signal abnormality in the  right frontal lobe. Findings is nonspecific and may represent  inflammatory/demyelinating disease. Single metastatic lesion could have  similar appearance in the appropriate clinical setting.    MRI C Spine w/wo 08/17/19 Unremarkable MRI of the cervical spine.    MRI T Spine 920/20 Stable T5 vertebral body atypical hemangioma. Otherwise unremarkable MRI  of the thoracic spine.    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 Abnormal gait  R26.9 ANA (ANTINUCLEAR ANTIBODIES), SERUM     Vitamin B12   2. Cognitive changes  R41.89 Referral to Neuropsych Testing     Orders Placed This Encounter   . ANA (ANTINUCLEAR ANTIBODIES), SERUM   . Vitamin B12   . 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 Her story is a little atypical and I find it difficult to localize, exam had multiple abnormalities but they were all inconsistent and embellished, granted she did have a foci of enhancement  on her brain which is why she was admitted for expedited work up with revealed normal C, T spine,  a few new punctate WM lesions and LP with only abnormality of abnormal kappa light chain (after LP sudden resolution of droopy face and slurred speech). Discussed with patient and husband these lesions do not follow a specific disease pattern and that at this time we do not have an unifying diagnosis to explain her symptoms. Encouraged to seek second opinion but she stated we are her second opinion.      slurred speech, asymmetric face (fluctuated on exam and completely resolved after LP), gait problems, BLE numbness,  LUEs abnormal movements, episodes of urinary incontinence and cognitive changes  Unclear etiology, patient with extensive negative work up here and outside facility concern for at least significant functional component vs other  - Unremarkable MRI C, T spine here in sept and Nov  - MRI brain with punctate WM lesions that are rather non specific (explained to patient why we do not think at this time she has either -MS or neurosarcoid)  - LP does have elevated free light kappa change, but the rest of her work up and history doe Environmental health practitioner at this time with a specific disease  - Explained why we will not use steroids as there are multiple side effects and we do not have a diagnosis at this time   - Neuropsych testing to evaluate her cognitive complaints  - ordered ANA and if positive will order further autoimmune testing  - Instructed to ask PCP about possible urology referral given her ongoing urinary incontinence   - RTC 6 months and will plan to do MRI surveillance every 2 years unless symptoms arise     M. Jerl Mina   PGY 3 Neurology resident - [phone]  10/09/19 14:51  Total face-to-face time by staff:38minuteswas spent on counseling/coordination of care regarding:Less likely demyelinating disorder like -MS ,will order ANA,B12 gait instability less likley sensory ataxia,MRI imaging,lab study.  Roseanne Juenger md

## 2019-10-10 LAB — ANA (ANTINUCLEAR ANTIBODIES), SERUM
ANTI-NUCLEAR ANTIBODIES,QUALITATIVE: NEGATIVE
ANTI-NUCLEAR ANTIBODIES,QUANTITATIVE: 0.1 {index_val} (ref <=0.90)

## 2019-11-10 IMAGING — MR MRA HEAD WITH AND WITHOUT CONTRAST
6 of 8 series · 26 of 48 positions shown · IV contrast (gadavist)
Comparison: None available.

EXAM:  MRA HEAD WITH AND WITHOUT CONTRAST
INDICATION: Stroke-like symptoms.
TECHNIQUE: MR angiogram and venogram was performed utilizing 3D time-of-flight technique and following intravenous administration of 5 mL of Gadavist.

[Series 5: DWI · axial · 5.0mm · 1.35mm/px · z∈[-35,+91]mm · 6 of 88 slices shown (1 of 3)]
[im 1/88]
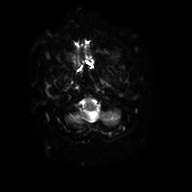
[im 18/88]
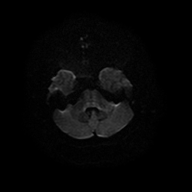
[im 35/88]
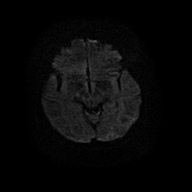
[im 53/88]
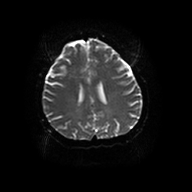
[im 70/88]
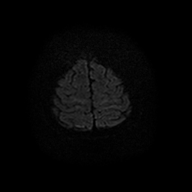
[im 88/88]
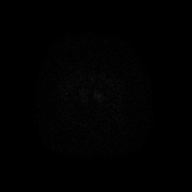

[Series 6: DWI · axial · 5.0mm · 1.35mm/px · 1 of 22 slices shown (2 of 3)]
[im 1/22]
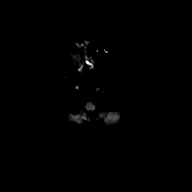

[Series 7: DWI · axial · 5.0mm · 1.35mm/px · 1 of 21 slices shown (3 of 3)]
[im 1/21]
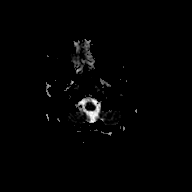

[Series 8: T2 · axial · 5.0mm · 0.43mm/px · z∈[-43,+101]mm · 2 of 25 slices shown]
[im 1/25]
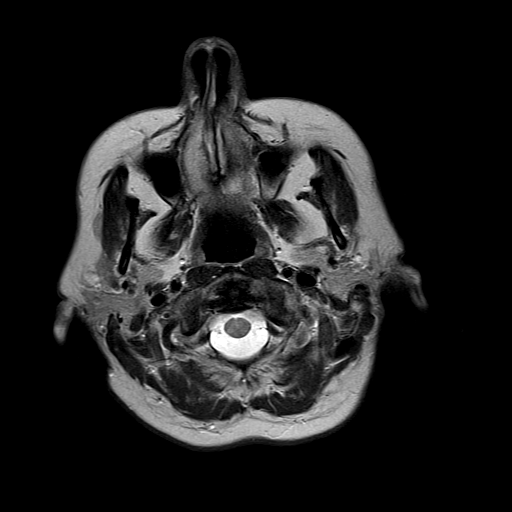
[im 25/25]
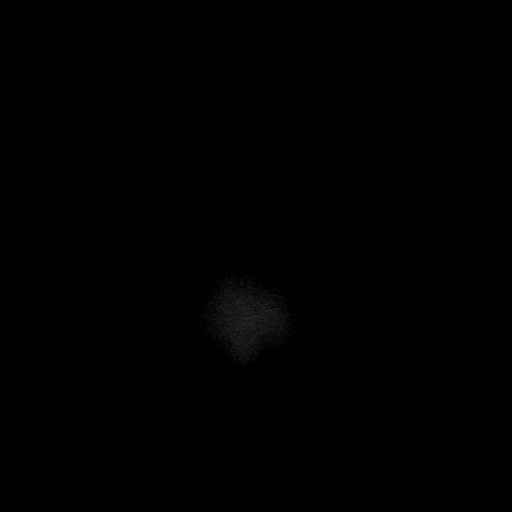

[Series 11: TOF · coronal · 2.0mm · 0.78mm/px · 8 of 130 slices shown]
[im 1/130]
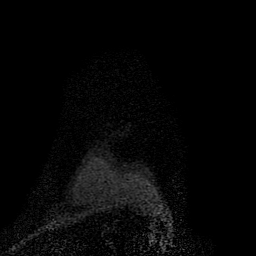
[im 19/130]
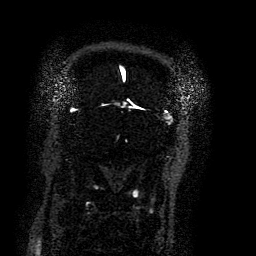
[im 37/130]
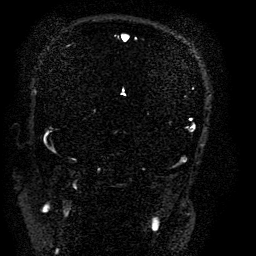
[im 56/130]
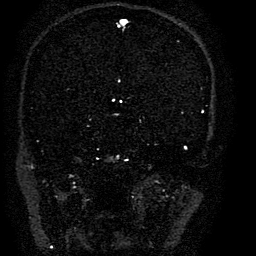
[im 74/130]
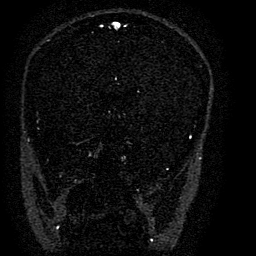
[im 93/130]
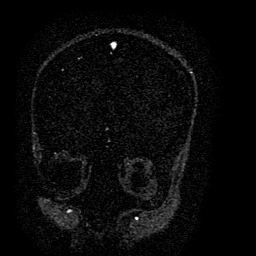
[im 111/130]
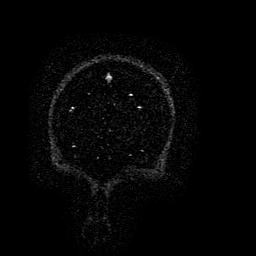
[im 130/130]
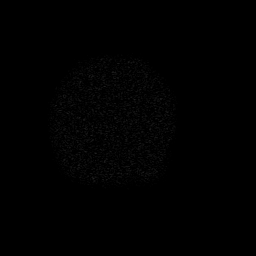

[Series 36: MRV · coronal · 2.0mm · 0.78mm/px · 8 of 130 slices shown]
[im 1/130]
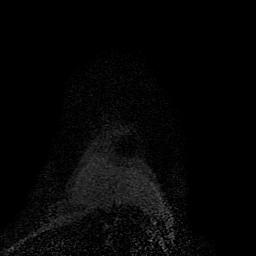
[im 19/130]
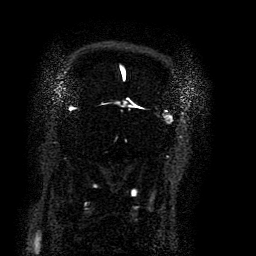
[im 37/130]
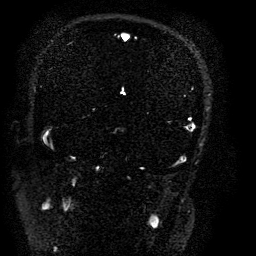
[im 56/130]
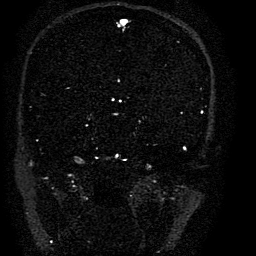
[im 74/130]
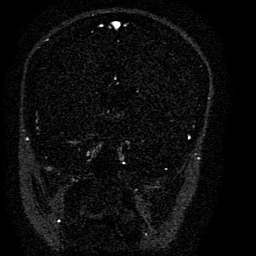
[im 93/130]
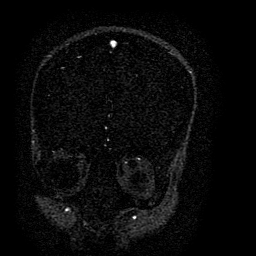
[im 111/130]
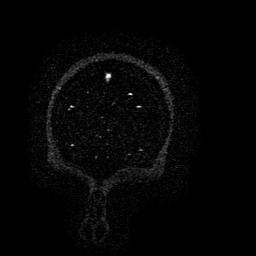
[im 130/130]
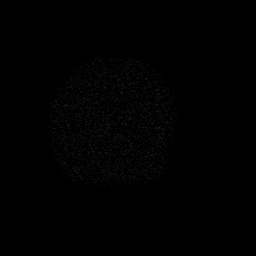

[26 of 48 positions shown; findings below may reference images not displayed]

FINDINGS: Major arteries of the circle[place]of[place]is are patent without hemodynamically significant stenosis or occlusion at any level. There is a questionable 2 mm right posterior cerebral artery P1 segment aneurysm. Left posterior communicating artery is not definitively visualized and may be below the imaging resolution.

Major dural venous sinuses appear patent without definite imaging evidence of thrombosis.
IMPRESSION: A questionable 2 mm right posterior cerebral artery P1 segment aneurysm. Follow-up CT angiogram is recommended for confirmation. 

Unremarkable dural venous sinuses.

## 2019-11-24 IMAGING — CT CTA HEAD
4 of 6 series · 8 of 33 positions shown · IV contrast (CONTRAST)
Comparison: MR angiogram dated 01/04/2020.
COMPARISON: MR angiogram dated 01/04/2020.

------------- REPORT [id number] -------------
EXAM:  CTA HEAD
INDICATION: Aneurysm.
TECHNIQUE: MR angiogram of the circle[place]of[place]is was performed following intravenous administration of 75 mL of Optiray 300. Additional volume rendered images were also obtained. Images were reviewed in multiple windows and projections. Exam was performed using 1 or more of the following dose reduction techniques: Automated exposure control, adjustment of the mA and/or kV according to patient size, or the use of iterative reconstruction technique.
INDICATION: Difficulty talking and walking. Aneurysm.

[Series 8020: ax post · axial · 0.39mm/px · z∈[+1478,+1524]mm · 2 of 70 slices shown]
[im 24/70  soft-tissue]
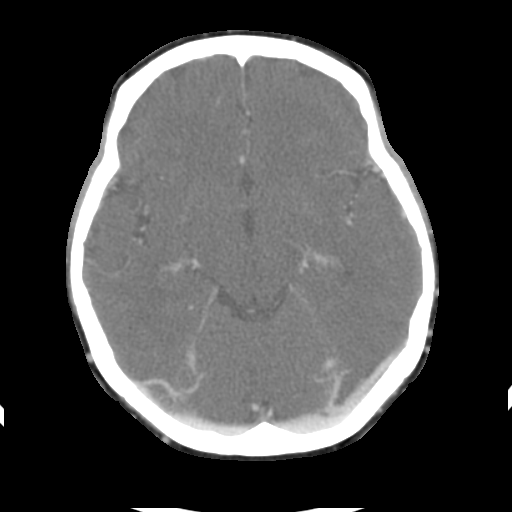
[im 47/70  soft-tissue]
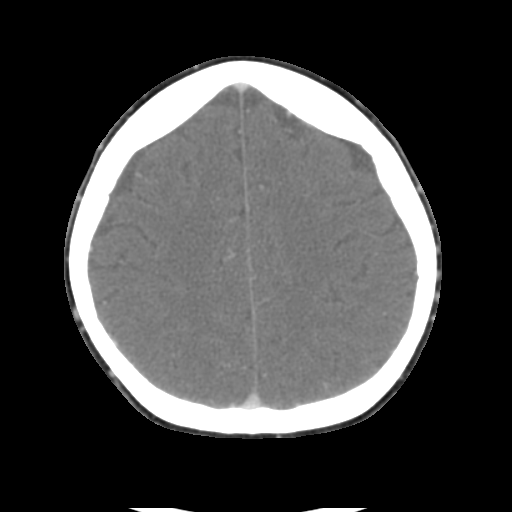

[Series 8028: sag · sagittal · 0.40mm/px · 1 of 69 slices shown]
[im 35/69  soft-tissue]
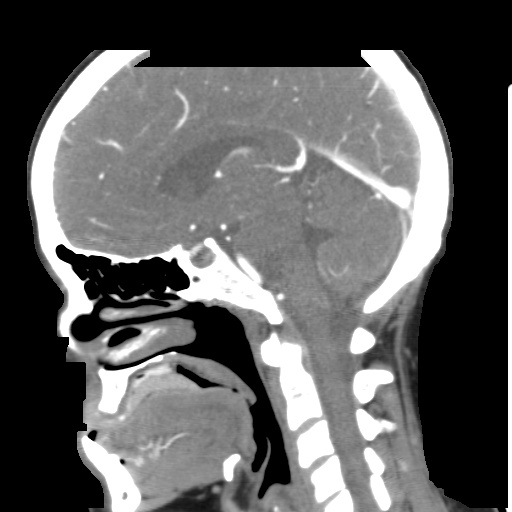

[ax wo · axial · 0.42mm/px · z∈[+1482,+1524]mm · 2 of 64 slices shown]
[im 22/64  soft-tissue]
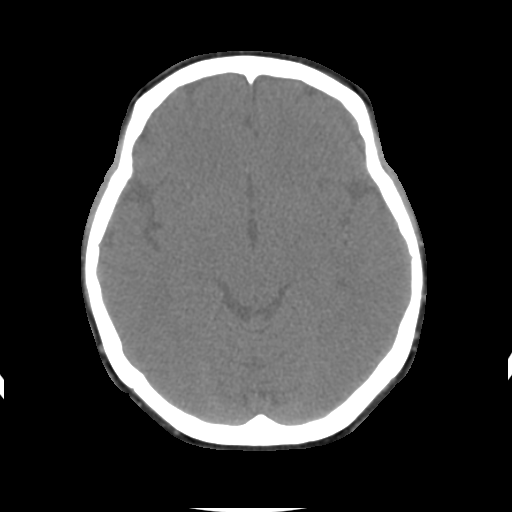
[im 43/64  soft-tissue]
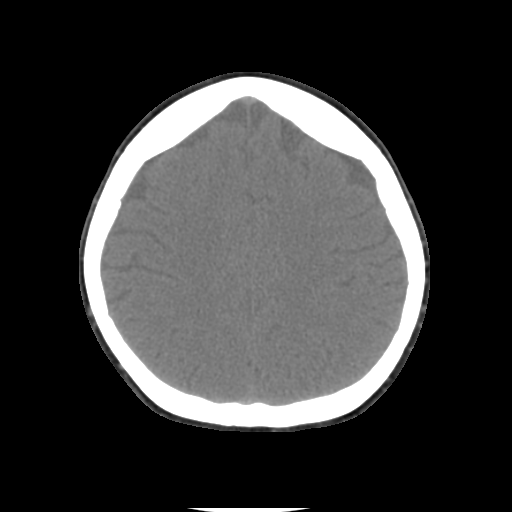

[ax thin · axial · 0.33mm/px · z∈[+1392,+1466]mm · 3 of 75 slices shown]
[im 19/75  soft-tissue]
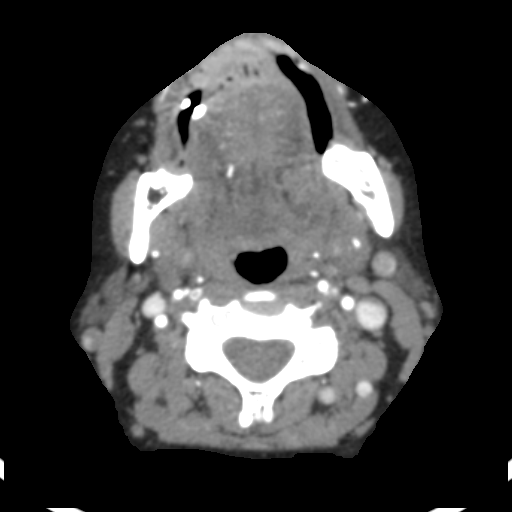
[im 38/75  bone]
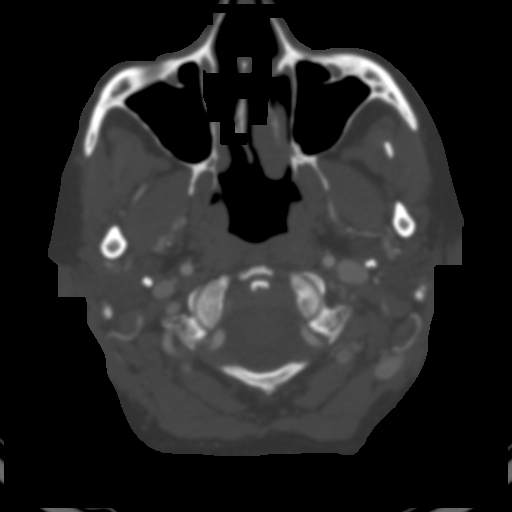
[im 56/75  soft-tissue]
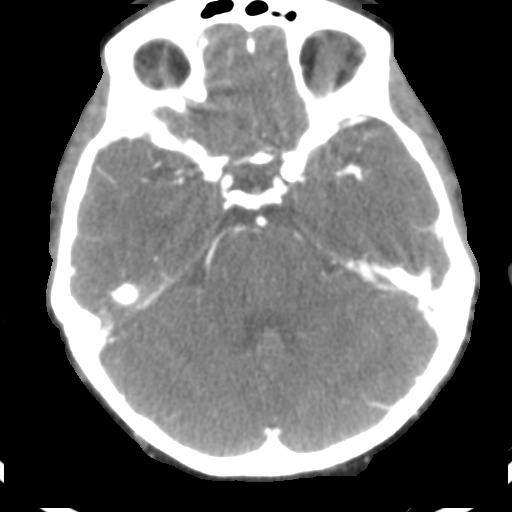

[8 of 33 positions shown; findings below may reference images not displayed]

FINDINGS: Major arteries of the circle[place]of[place] are patent without significant atherosclerotic disease, hemodynamically significant stenosis, occlusion or aneurysm formation at any level. Left posterior communicating artery is again not definitively visualized and may be below the imaging resolution. Previously noted area of concern within the right posterior cerebral artery P1 segment appears to have resolved on the current exam. There is no abnormal enhancement.
IMPRESSION: Unremarkable exam.

------------- REPORT [id number] -------------
EXAM:  CTA HEAD
FINDINGS: Major arteries of the circle[place]of[place] are patent without significant atherosclerotic disease, hemodynamically significant stenosis, occlusion or aneurysm formation at any level. Left posterior communicating artery is again not definitively visualized and may be below the imaging resolution. Previously noted area of concern within the right posterior cerebral artery P1 segment appears to have resolved on the current exam. There is no abnormal enhancement.
IMPRESSION: Unremarkable exam.

## 2019-12-25 ENCOUNTER — Encounter (INDEPENDENT_AMBULATORY_CARE_PROVIDER_SITE_OTHER): Payer: Self-pay | Admitting: Student in an Organized Health Care Education/Training Program

## 2020-01-02 ENCOUNTER — Other Ambulatory Visit: Payer: Self-pay

## 2020-01-02 ENCOUNTER — Ambulatory Visit
Admission: RE | Admit: 2020-01-02 | Discharge: 2020-01-02 | Disposition: A | Discharge status: Home / Self Care | Payer: BC Managed Care – PPO | Source: Ambulatory Visit | Attending: Family | Admitting: Family

## 2020-01-02 DIAGNOSIS — R9089 Other abnormal findings on diagnostic imaging of central nervous system: Secondary | ICD-10-CM

## 2020-01-02 DIAGNOSIS — G43119 Migraine with aura, intractable, without status migrainosus: Secondary | ICD-10-CM

## 2020-01-02 DIAGNOSIS — R569 Unspecified convulsions: Secondary | ICD-10-CM

## 2020-01-02 MED ORDER — GADOBUTROL 7.5 MMOL/7.5 ML (1 MMOL/ML) INTRAVENOUS SOLUTION
6.00 mL | INTRAVENOUS | Status: AC
Start: 2020-01-02 — End: 2020-01-02
  Administered 2020-01-02: 15:00:00 6 mL via INTRAVENOUS

## 2020-01-05 ENCOUNTER — Encounter (INDEPENDENT_AMBULATORY_CARE_PROVIDER_SITE_OTHER): Payer: Self-pay | Admitting: Family

## 2020-02-18 IMAGING — MR MRI CERVICAL SPINE WITHOUT AND WITH CONTRAST
6 of 8 series · 31 of 48 positions shown · IV contrast (gadavist)
Comparison: None available.

EXAM:  MRI CERVICAL SPINE WITHOUT AND WITH CONTRAST
INDICATION: Demyelinating disease.
TECHNIQUE: Multiplanar multisequential MRI of the cervical spine was performed without and with 5 mL of Gadavist.

[Series 7: T2 · sagittal · 3.0mm · 0.75mm/px · 5 of 13 slices shown (1 of 2)]
[im 1/13]
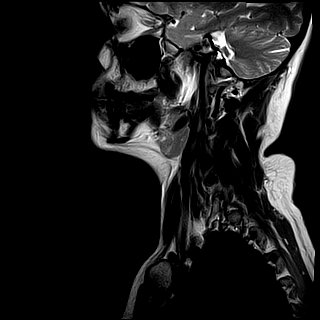
[im 4/13]
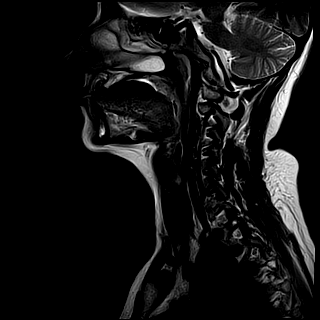
[im 7/13]
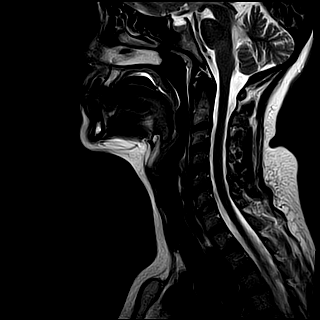
[im 10/13]
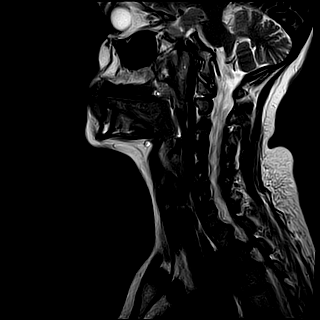
[im 13/13]
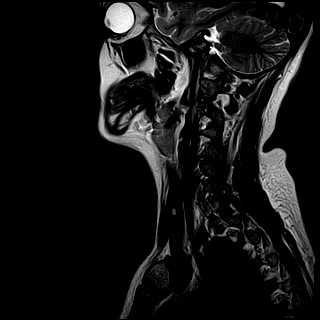

[Series 8: T1 · sagittal · 3.0mm · 0.47mm/px · 4 of 13 slices shown]
[im 1/13]
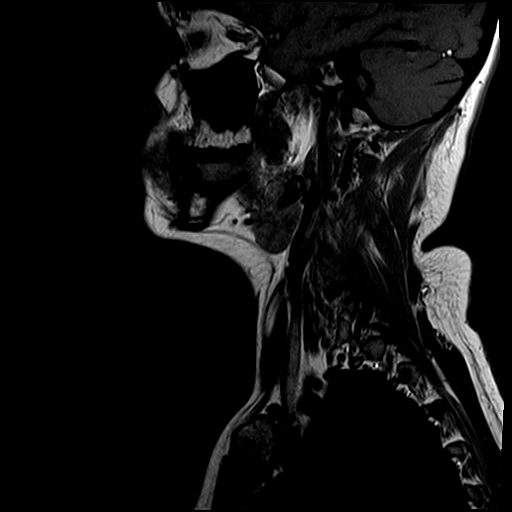
[im 5/13]
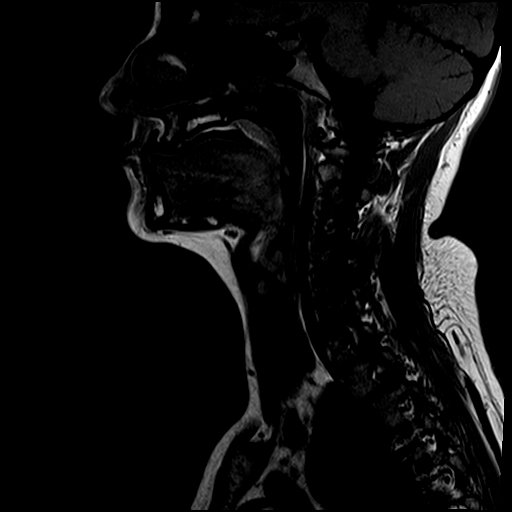
[im 9/13]
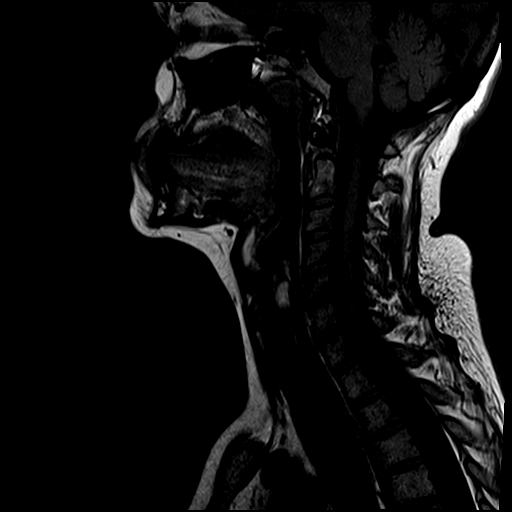
[im 13/13]
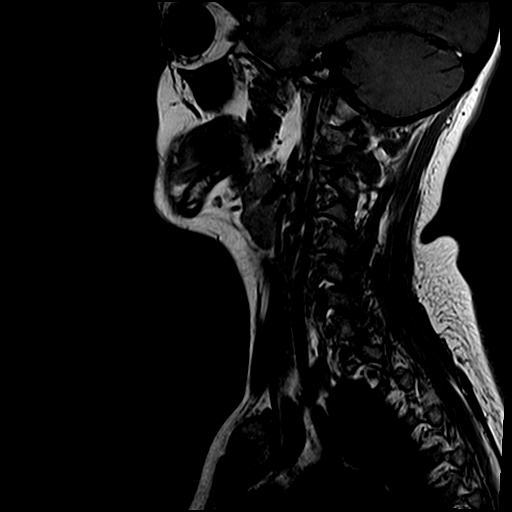

[Series 9: T1 fat-sat · sagittal · 3.0mm · 0.75mm/px · 4 of 13 slices shown (1 of 2)]
[im 1/13]
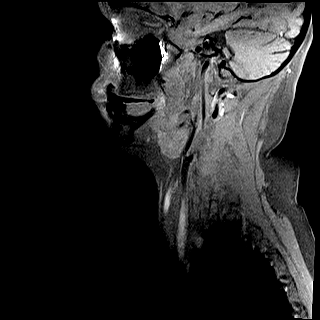
[im 5/13]
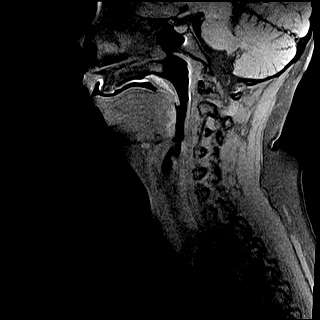
[im 9/13]
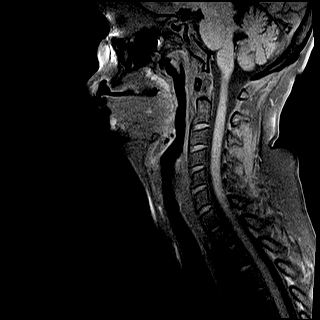
[im 13/13]
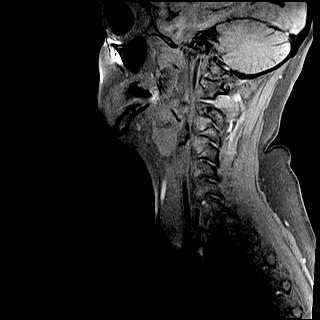

[Series 11: T2 · axial · 3.0mm · 0.35mm/px · z∈[-73,+26]mm · 9 of 26 slices shown (2 of 2)]
[im 1/26]
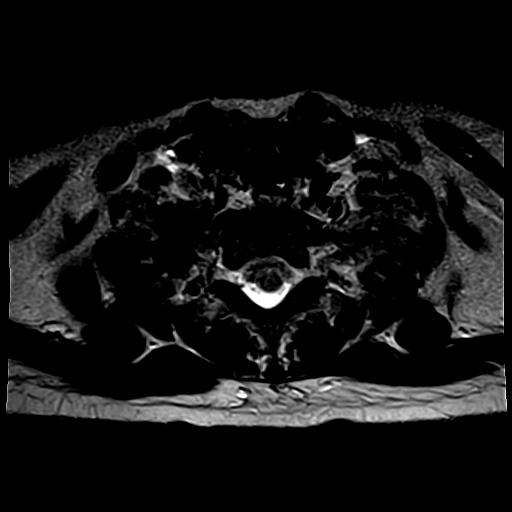
[im 4/26]
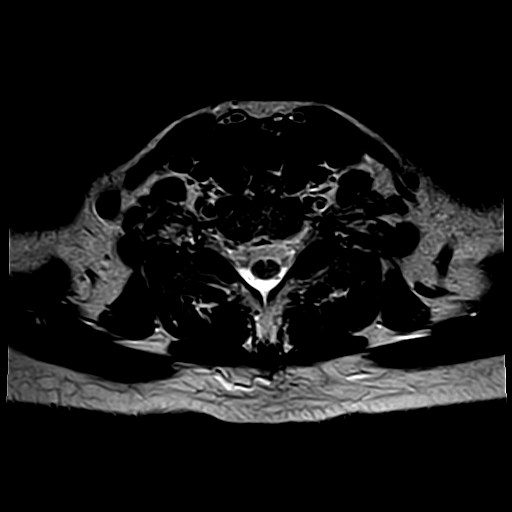
[im 7/26]
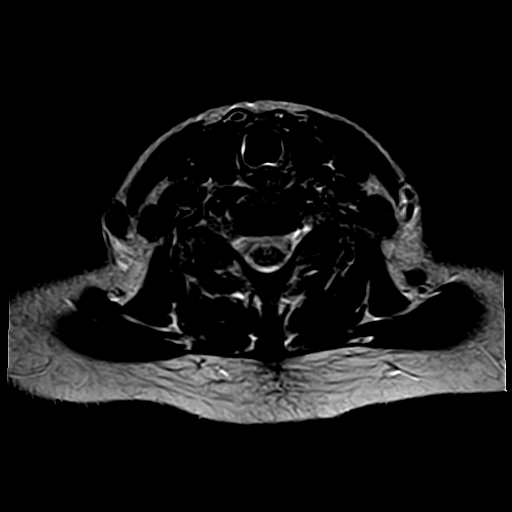
[im 10/26]
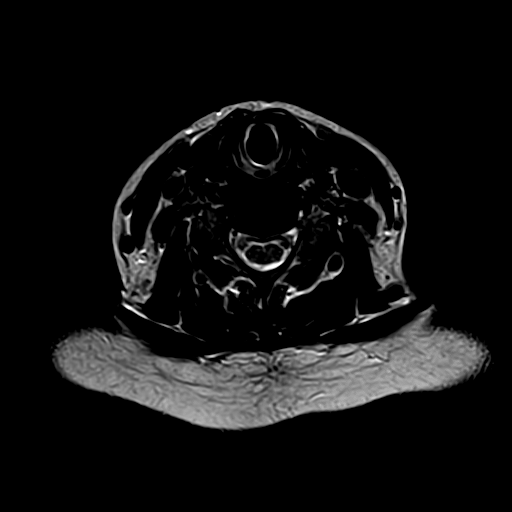
[im 13/26]
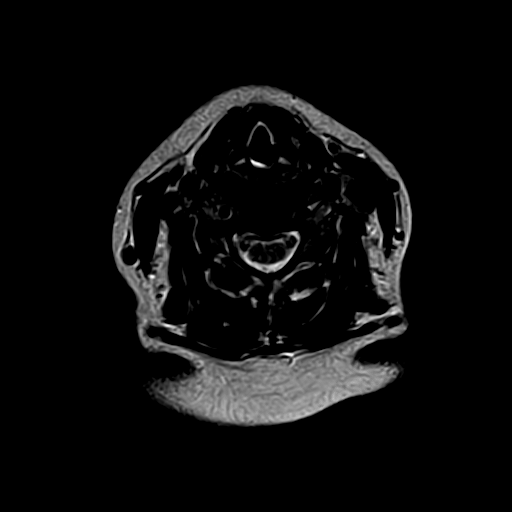
[im 16/26]
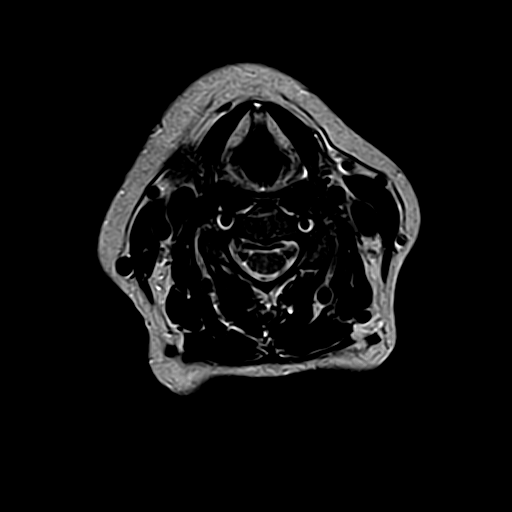
[im 19/26]
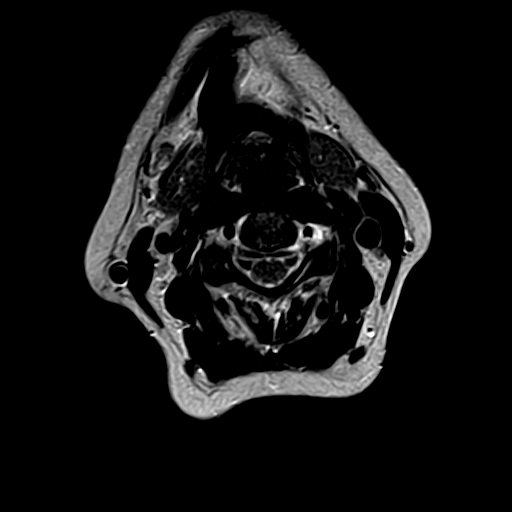
[im 22/26]
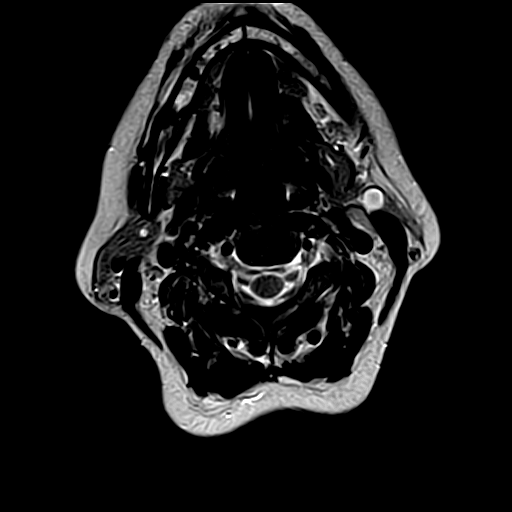
[im 26/26]
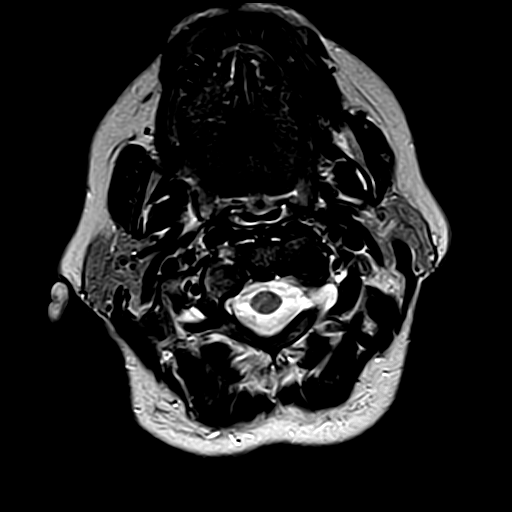

[Series 12: T1 fat-sat · axial · 3.0mm · 0.70mm/px · z∈[-73,+26]mm · 8 of 26 slices shown (2 of 2)]
[im 1/26]
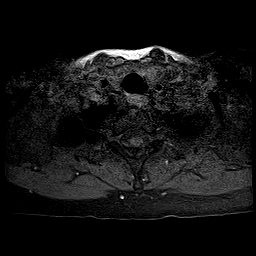
[im 4/26]
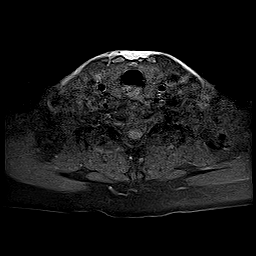
[im 7/26]
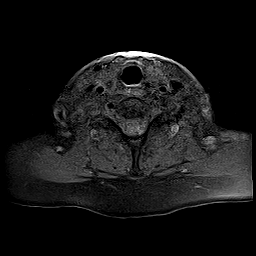
[im 10/26]
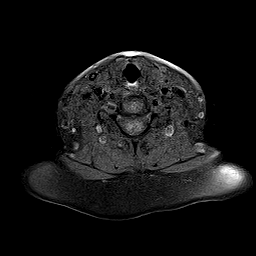
[im 16/26]
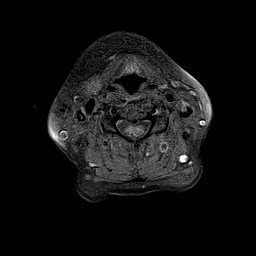
[im 19/26]
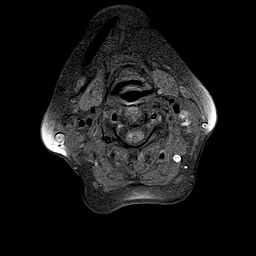
[im 22/26]
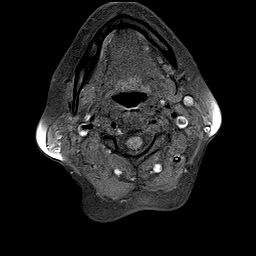
[im 26/26]
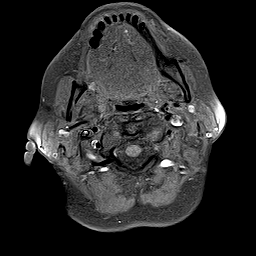

[Series 17: T1 fat-sat post-contrast · sagittal · 3.0mm · 0.75mm/px · 1 of 13 slices shown]
[im 1/13]
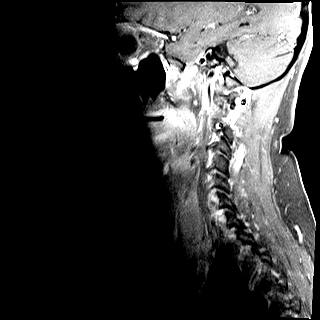

[31 of 48 positions shown; findings below may reference images not displayed]

FINDINGS: Vertebral bodies are normal in height, alignment and signal intensity. There is no acute fracture or subluxation. Visualized spinal cord is also normal in signal intensity without evidence of compression or demyelinating disease at any level.

No significant degenerative disc disease, spinal canal or neural foraminal stenosis is seen at any level. Following intravenous contrast administration, there is no abnormal spinal cord or paraspinal soft tissue enhancement.
IMPRESSION: Unremarkable exam.

## 2020-02-18 IMAGING — MR MRI BRAIN WITHOUT AND WITH CONTRAST
10 of 14 series · 34 of 48 positions shown · IV contrast (gadavist)
Comparison: MR angiogram dated 11/10/2019 and CT angiogram dated 11/24/2019.

EXAM:  MRI BRAIN WITHOUT AND WITH CONTRAST
INDICATION: Demyelinating disease.
TECHNIQUE: Multiplanar multisequential MRI of the brain was performed without and with 5 mL of Gadavist.

[Series 8: DWI · axial · 5.0mm · 1.35mm/px · z∈[-23,+103]mm · 8 of 88 slices shown (1 of 3)]
[im 1/88]
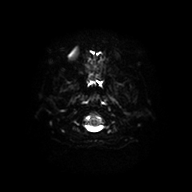
[im 10/88]
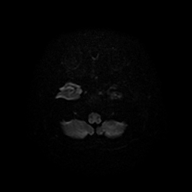
[im 30/88]
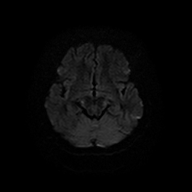
[im 39/88]
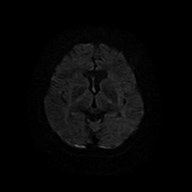
[im 49/88]
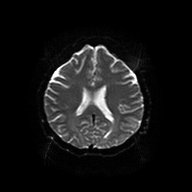
[im 59/88]
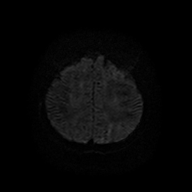
[im 78/88]
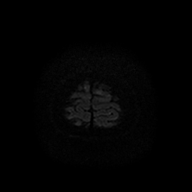
[im 88/88]
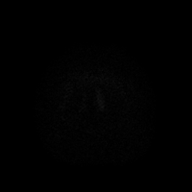

[Series 9: DWI · axial · 5.0mm · 1.35mm/px · z∈[-23,+103]mm · 3 of 22 slices shown (2 of 3)]
[im 1/22]
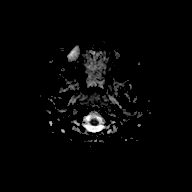
[im 11/22]
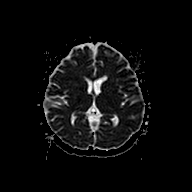
[im 22/22]
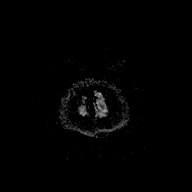

[Series 10: DWI · axial · 5.0mm · 1.35mm/px · z∈[-23,+103]mm · 2 of 22 slices shown (3 of 3)]
[im 1/22]
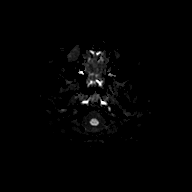
[im 22/22]
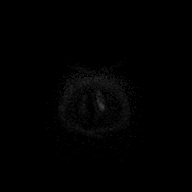

[Series 11: FLAIR · sagittal · 5.0mm · 0.75mm/px · 3 of 28 slices shown (1 of 2)]
[im 1/28]
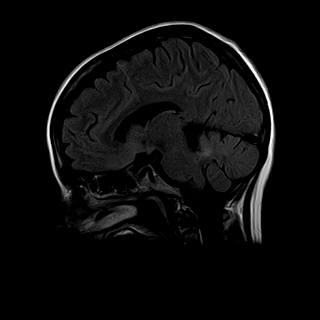
[im 14/28]
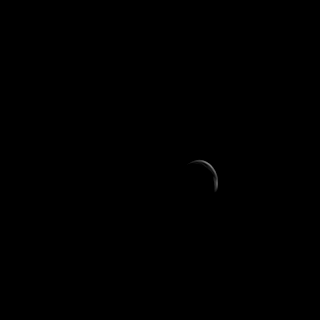
[im 28/28]
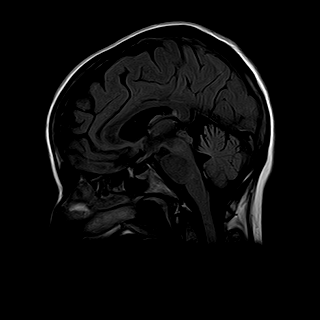

[Series 15: T1 · axial · 4.0mm · 0.43mm/px · z∈[-34,+120]mm · 3 of 36 slices shown]
[im 1/36]
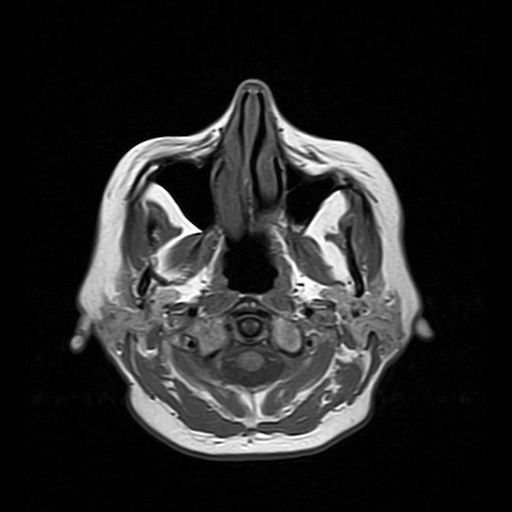
[im 18/36]
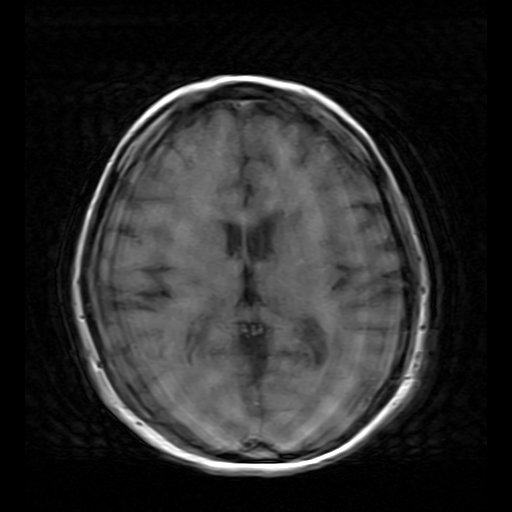
[im 36/36]
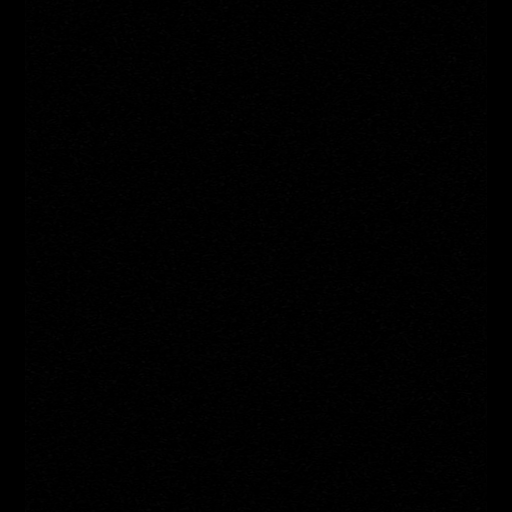

[Series 16: T2 · coronal · 5.0mm · 0.43mm/px · 3 of 28 slices shown]
[im 1/28]
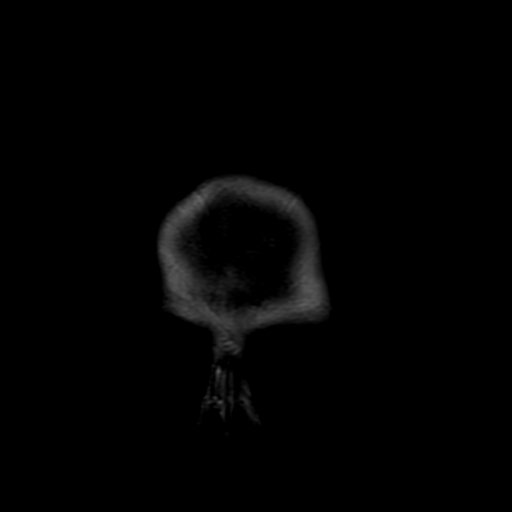
[im 14/28]
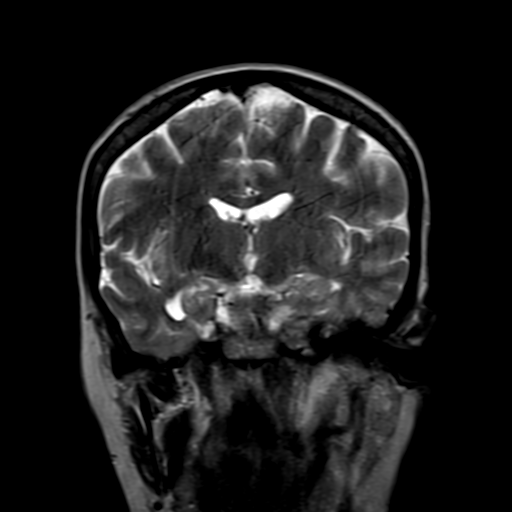
[im 28/28]
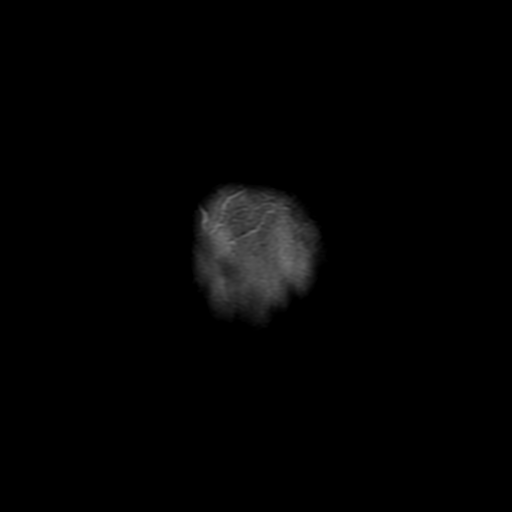

[Series 17: T1 fat-sat · coronal · 5.0mm · 0.57mm/px · 3 of 28 slices shown (1 of 2)]
[im 1/28]
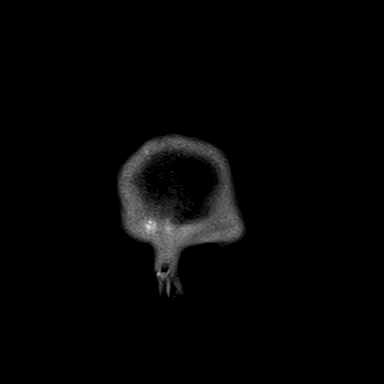
[im 14/28]
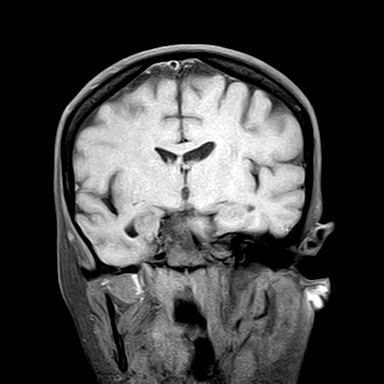
[im 28/28]
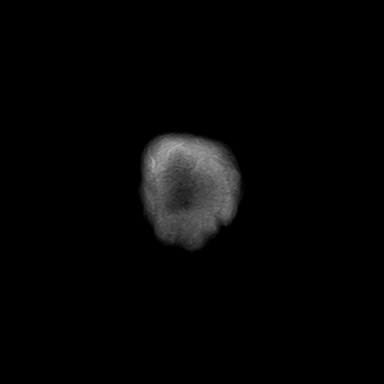

[Series 18: T1 fat-sat · axial · 4.0mm · 0.43mm/px · z∈[-34,+120]mm · 3 of 36 slices shown (2 of 2)]
[im 1/36]
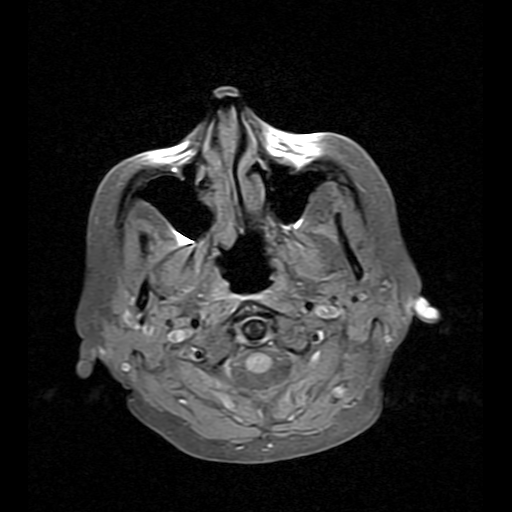
[im 18/36]
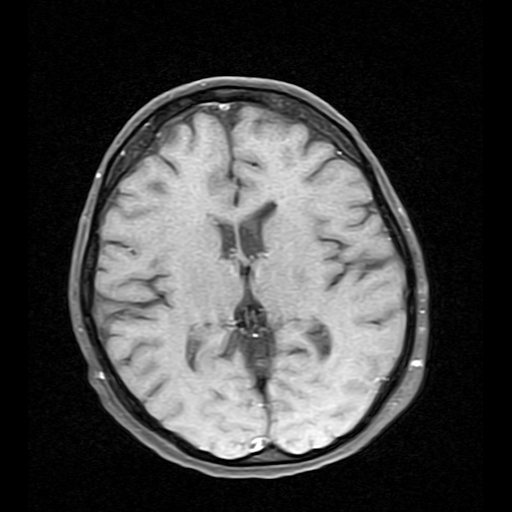
[im 36/36]
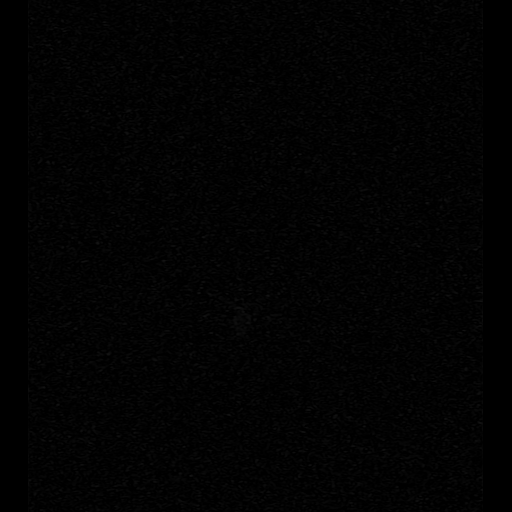

[Series 19: FLAIR · axial · 4.0mm · 0.43mm/px · z∈[-34,+120]mm · 3 of 36 slices shown (2 of 2)]
[im 1/36]
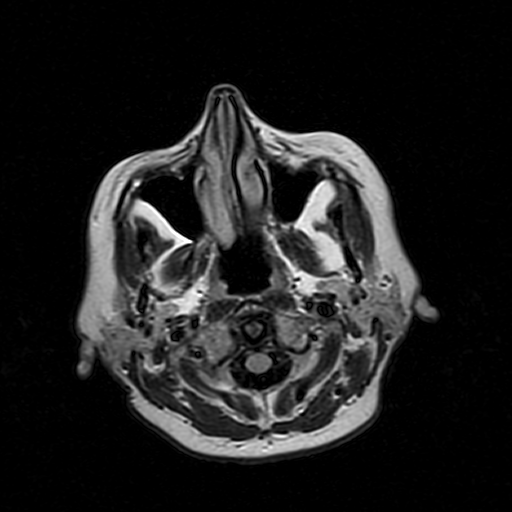
[im 18/36]
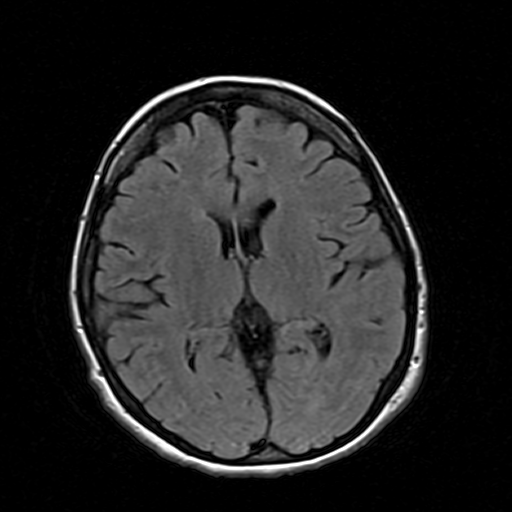
[im 36/36]
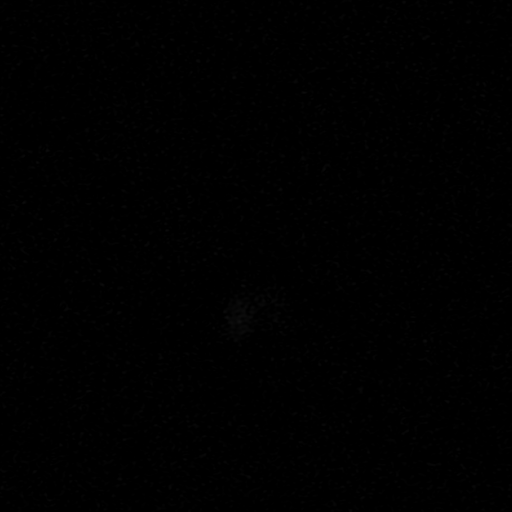

[Series 20: T1 fat-sat post-contrast · coronal · 5.0mm · 0.57mm/px · 3 of 28 slices shown]
[im 1/28]
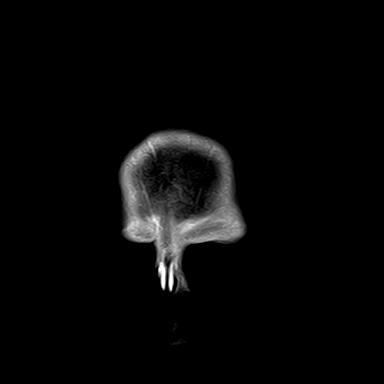
[im 14/28]
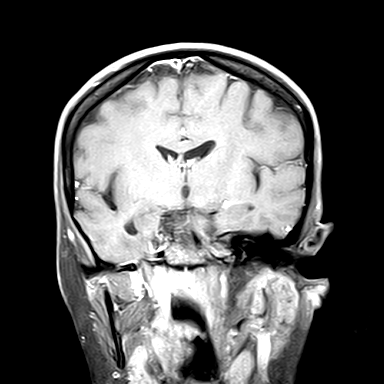
[im 28/28]
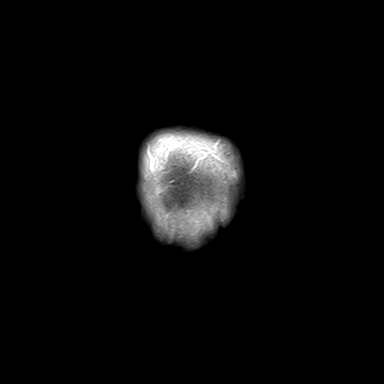

[34 of 48 positions shown; findings below may reference images not displayed]

FINDINGS: Ventricular and sulcal size is normal for the patient's age. There is faint T2/FLAIR is signal hyperintensity within the cortex at the level of the right Siu Yuen fissure. Another similar small area is also noted within the subcortical right anterior temporal lobe. Faint T2/FLAIR signal hyperintensity is also noted within the left superior cerebellum, slightly lateral to the midline. There is no mass effect, midline shift or intracranial hemorrhage. There is no evidence of acute infarction or prior microhemorrhages. Skull base flow voids and basal cisterns are patent. Sagittal survey of midline structures is unremarkable. 

Following intravenous contrast administration, there is no abnormal parenchymal or leptomeningeal enhancement. There are no extra-axial fluid collections. Visualized paranasal sinuses, mastoid air cells and orbital contents are unremarkable.
IMPRESSION: 1. Faint nonspecific T2/FLAIR signal hyperintensity foci within the right frontal and temporal lobes as well as the left superior cerebellum. Continued close clinical follow-up is recommended. 

 2. No abnormal parenchymal or leptomeningeal enhancement.

## 2020-02-25 IMAGING — MR MRI THORACIC SPINE WITHOUT AND WITH CONTRAST
6 of 9 series · 27 of 48 positions shown · IV contrast (gadavist)
Comparison: None.

EXAM:  MRI THORACIC SPINE WITHOUT AND WITH CONTRAST
INDICATION: Gait disturbance.
TECHNIQUE: An MRI of the patient’s thoracic spine is performed with the acquisition of multiple images in the axial, sagittal and coronal planes without and with 5 mL Gadavist.  T1 and T2-weighted scanning protocols are utilized.

[Series 6: T2 · sagittal · 4.0mm · 0.78mm/px · 5 of 13 slices shown (1 of 2)]
[im 1/13]
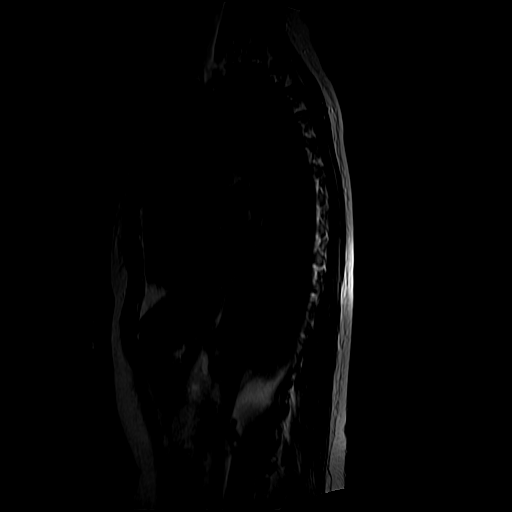
[im 4/13]
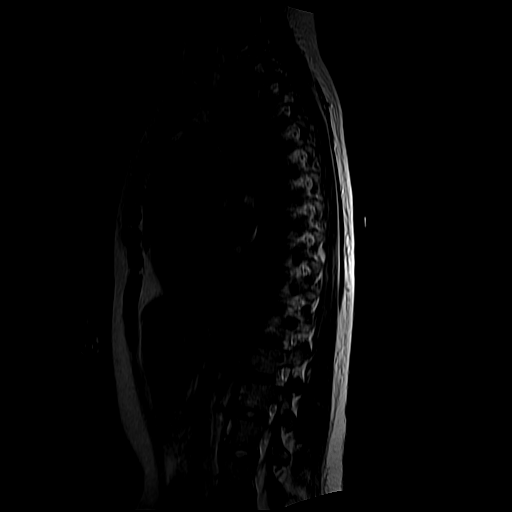
[im 7/13]
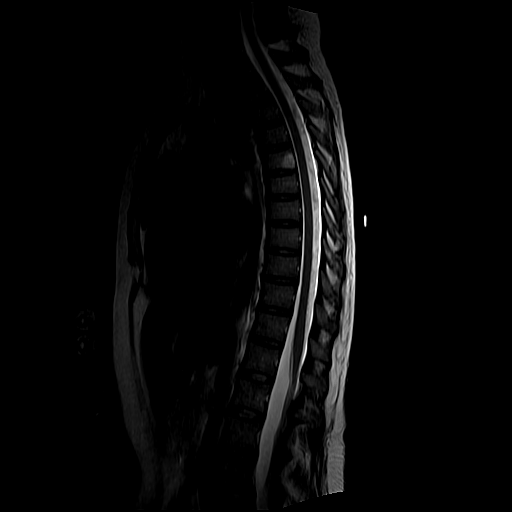
[im 10/13]
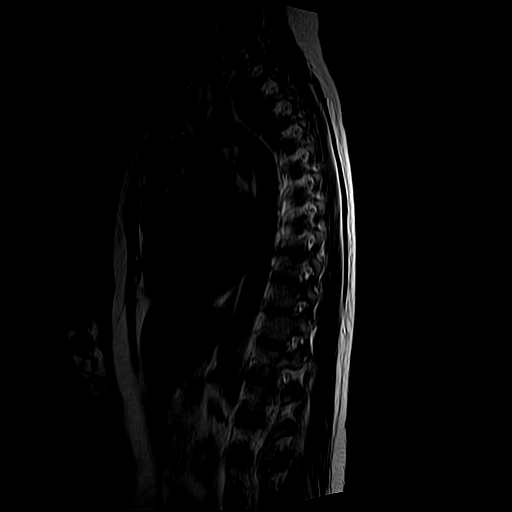
[im 13/13]
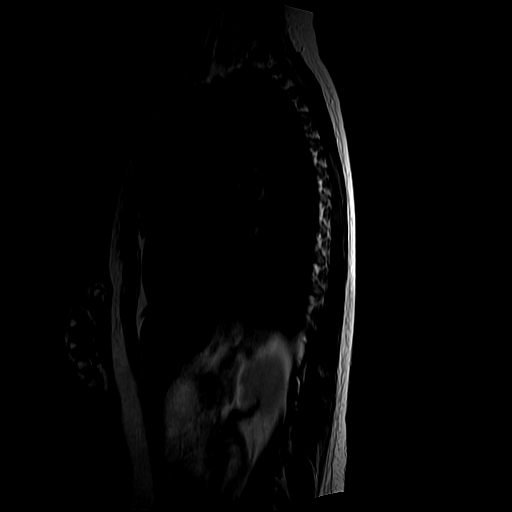

[Series 8: T1 fat-sat · sagittal · 4.0mm · 0.78mm/px · 5 of 13 slices shown (1 of 2)]
[im 1/13]
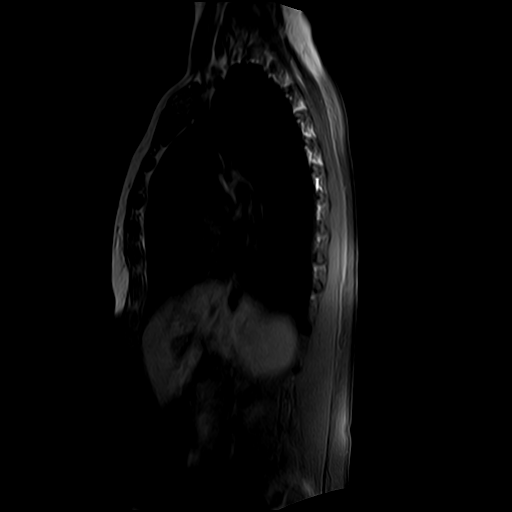
[im 4/13]
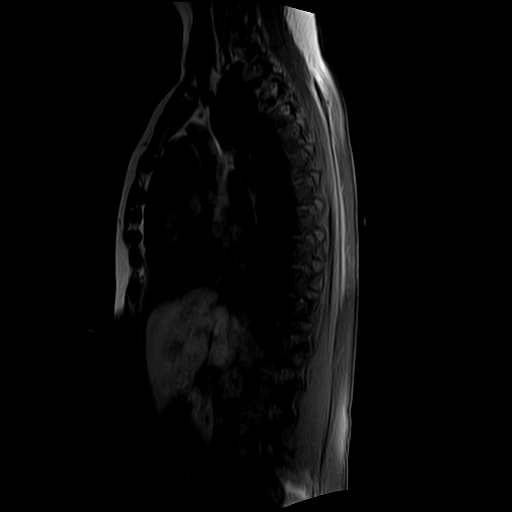
[im 7/13]
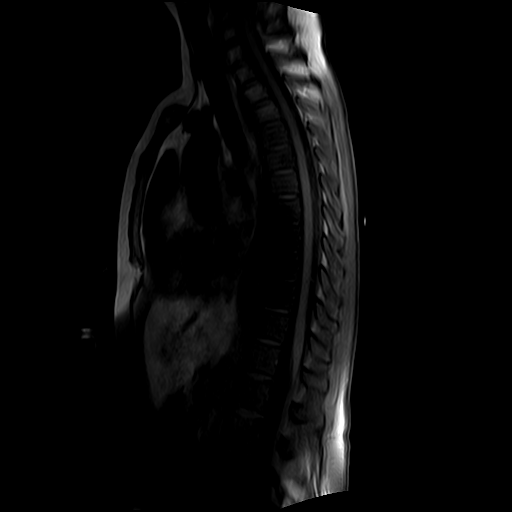
[im 10/13]
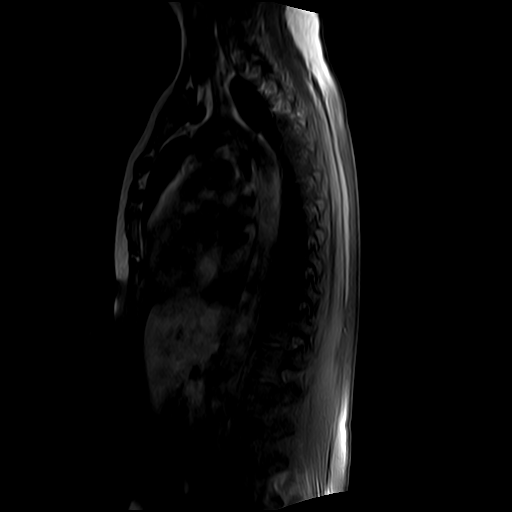
[im 13/13]
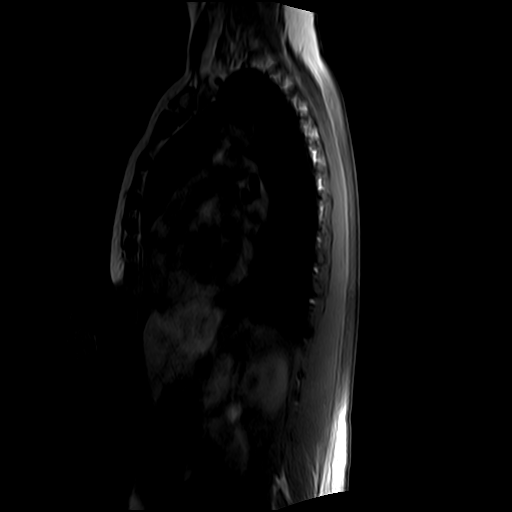

[Series 9: T1 · sagittal · 4.0mm · 0.78mm/px · 4 of 13 slices shown]
[im 1/13]
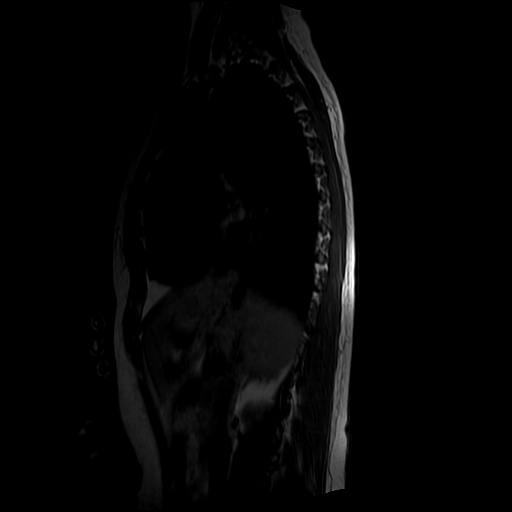
[im 5/13]
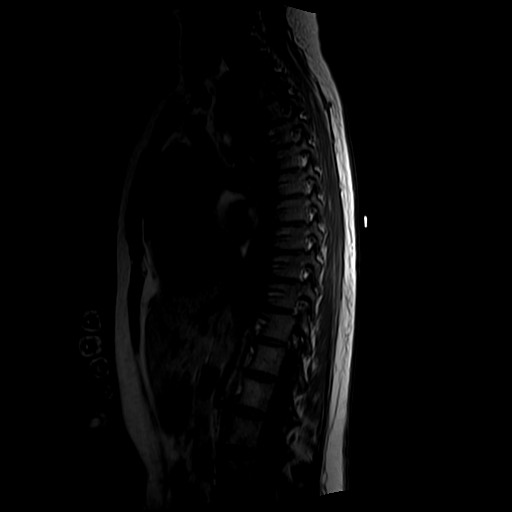
[im 9/13]
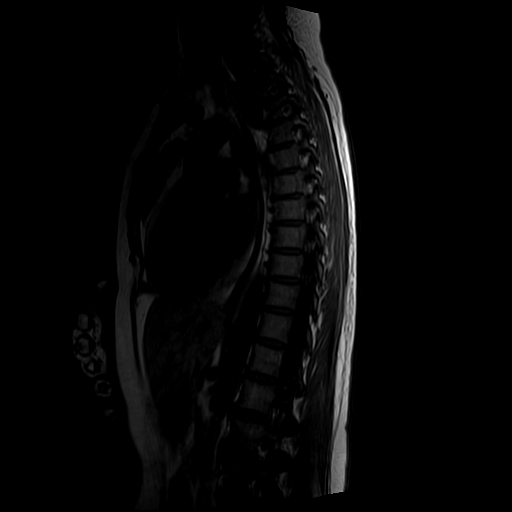
[im 13/13]
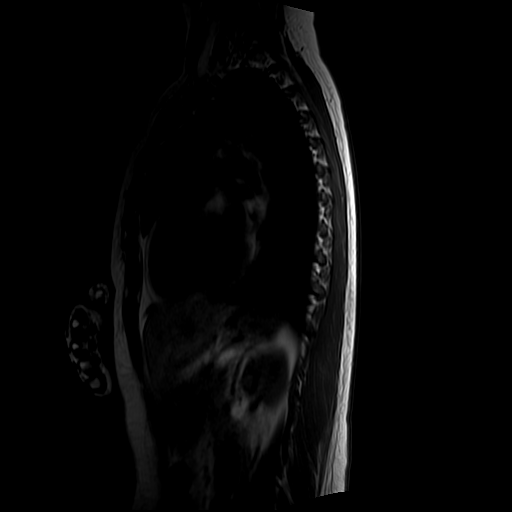

[Series 11: T2 · axial · 4.0mm · 0.62mm/px · z∈[-300,-86]mm · 4 of 12 slices shown (2 of 2)]
[im 1/12]
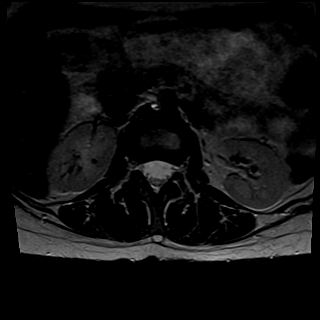
[im 4/12]
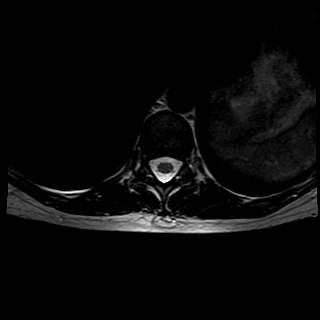
[im 8/12]
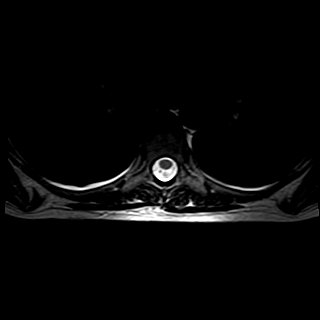
[im 12/12]
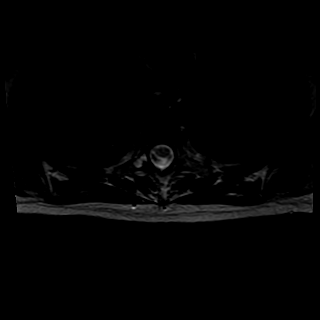

[Series 15: T1 fat-sat · axial · 9.0mm · 0.78mm/px · z∈[-333,-63]mm · 8 of 28 slices shown (2 of 2)]
[im 1/28]
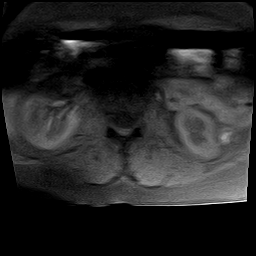
[im 4/28]
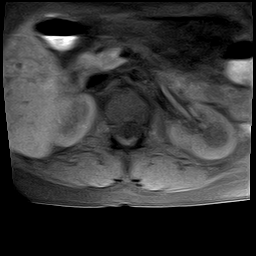
[im 7/28]
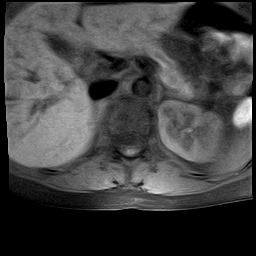
[im 11/28]
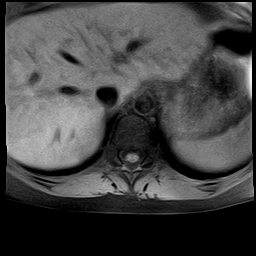
[im 17/28]
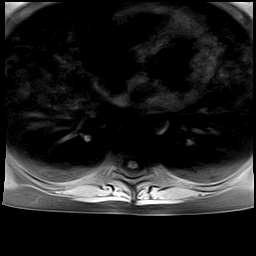
[im 21/28]
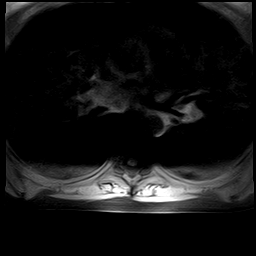
[im 24/28]
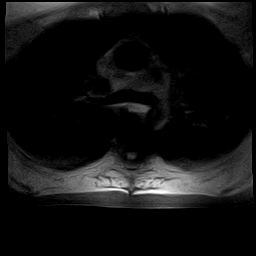
[im 28/28]
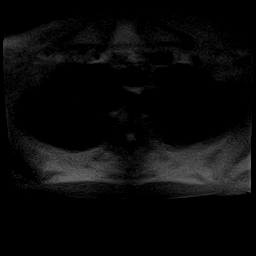

[Series 16: T1 fat-sat post-contrast · sagittal · 4.0mm · 0.78mm/px · 1 of 13 slices shown]
[im 1/13]
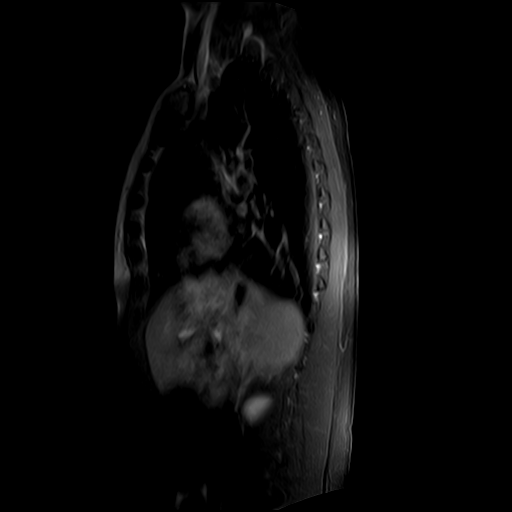

[27 of 48 positions shown; findings below may reference images not displayed]

FINDINGS: The sagittal images show the appearance of a hemangioma in an upper thoracic vertebra.  This appears to lie in either T5 or T6.  No posterior extensions of disc material are observed.  The signal intensity of the thoracic cord is normal and no expansile lesion is seen.  

Gadolinium is administered and the postgadolinium T1 sagittal and axial images show no evidence for an enhancing lesion.
IMPRESSION: Normal MRI of the thoracic spine.  

Focal hemangioma in either T5 or T6.

No enhancing lesion after gadolinium administration.

## 2020-03-15 ENCOUNTER — Telehealth (HOSPITAL_COMMUNITY): Payer: Self-pay

## 2020-03-15 NOTE — Telephone Encounter (Signed)
Case Manager left message with call back number to schedule therapy appt with C Genelle Gather, CASE MANAGER  03/15/2020, 10:02

## 2020-03-25 ENCOUNTER — Other Ambulatory Visit (INDEPENDENT_AMBULATORY_CARE_PROVIDER_SITE_OTHER): Payer: Self-pay | Admitting: Specialist

## 2020-03-25 DIAGNOSIS — G43109 Migraine with aura, not intractable, without status migrainosus: Secondary | ICD-10-CM

## 2020-03-25 DIAGNOSIS — G43009 Migraine without aura, not intractable, without status migrainosus: Secondary | ICD-10-CM

## 2020-03-25 NOTE — Telephone Encounter (Signed)
Last visit 10/09/19, Next visit 04/13/20  Please eRx  Edmonia Caprio RN

## 2020-04-13 ENCOUNTER — Encounter (INDEPENDENT_AMBULATORY_CARE_PROVIDER_SITE_OTHER): Payer: Self-pay | Admitting: Student in an Organized Health Care Education/Training Program

## 2020-05-19 NOTE — Progress Notes (Signed)
 Movement Disorders New Patient Visit    Date of Visit: 05/19/2020     Patient Name:  Jasmine Brown     MRN: [id number]     Referring Provider: Roni Cogan, *    Chief Complaint: involuntary movements, stiffness, difficult walking.     History of Present Illness:    I had the pleasure of evaluating Jasmine Brown in the Department of Neurology at [facility]. Jasmine Brown is a 44 y.o. female  presenting for an evaluation of multiple symptoms.     At age 37 she had an episode of left-sided weakness with a gait disorder. She was admitted to the hospital, and was told she had a stroke and her symptoms were stress related. It took 1 year for her to recover from this episode. She has been seeing neurologists for many years for headaches and multiple other symptoms. The last year of her life has been terrible because of her symptoms. She has intermittent symptoms that come and go. She has episodes of blurry vision and speech disturbances. She has muscle spasms and stiffness. Sometimes her arms and legs don't work.  She has episodes of involuntary movements of her right arm. 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 She has had an extensive evaluation, including MRI brain that showed a punctate enhancing lesion. She had a normal lumbar puncture.  She has a history of pseudoseizures. She is here for another opinion.     She reports multiple prior stressors/trauma. Her  Grandmother, who raised her,  passed away recently. She has been divorced twice and has two children. She notes that her symptoms worsened when she moved back to the town that she had lived in for a long time.     Past Medical History:  Past Medical History:   Diagnosis Date   . Depression    . Headache    . Psychogenic nonepileptic seizure      Past Surgical History:  Past Surgical History:   Procedure Laterality Date   . DILATION AND CURETTAGE OF UTERUS        Family History:  Family History   Problem  Relation Age of Onset   . Tremor Mother         Social History:  Education: 12th grade plus  Occupation:  Trying to get disability, worked as Immunologist Status: married. Divorced twice before. Has two children.  Lives: with husband and son         Current Medications:  Current Outpatient Medications   Medication Sig Dispense Refill   . ALPRAZolam  (XANAX ) 1 MG tablet Take 1 mg by mouth in the morning and at bedtime.     . baclofen (LIORESAL) 10 MG tablet Take 10 mg by mouth 2 times daily.     . DULoxetine  (CYMBALTA ) 60 MG DR capsule Take 60 mg by mouth daily.     . spironolactone  (ALDACTONE ) 100 MG tablet Take 100 mg by mouth daily.     . tiZANidine  (ZANAFLEX ) 4 MG tablet TAKE 1/2 TABLET BY MOUTH TWICE DAILY AS NEEDED     . topiramate  (TOPAMAX ) 100 MG tablet 300 mg daily.          No current facility-administered medications for this visit.         Examination:  BP 125/81   Pulse 98   Wt 47.4 kg (104 lb 9.6 oz)      General:  Mental Status: Alert and  oriented. Speech fluent. Follows commands easily. Provides details of history.  Cranial Nerves:t. Extraocular movements intact. Face symmetric. Sternocleidomastoid and Trapezius 5/5. Tongue midline, no dysarthria. Normal facial expression and voice.  Motor:  5/5 strength throughout giveway weakness. Normal tone. No tremor. No bradykinesia.  Reflexes: 2+ in arms and legs. Toes down.  Sensation: no sensation in arms and legs to temperature and vibration.  joint position sense impaired left toe.   Cerebellar: No dysmetria with finger-nose-finger and heel-to-shin. Finger-nose-finger is slow and jerky.   Gait: Narrow based, slow, bouncy,  Astasia abasia.     MRI brain report 2021: Persistent and somewhat less conspicuous scattered T2/FLAIR signal   hyperintensities. None of which demonstrate postcontrast enhancement on   this exam. No new T2/FLAIR signal abnormalities are detected. Findings   remain nonspecific but may suggest a demyelinating process in the    appropriate clinical setting.    MRI brain 2020 report: Small subcentimeter focus of enhancement and signal abnormality in the   right frontal lobe. Findings is nonspecific and may represent   inflammatory/demyelinating disease. Single metastatic lesion could have   similar appearance in the appropriate clinical setting.    MRI cervical spine report 2020: Unremarkable MRI examination of the cervical spine.      MRI thoracic spine report 2020: Stable T5 vertebral body atypical hemangioma. Otherwise unremarkable MRI   of the thoracic spine.      Impression/Plan: Jasmine Brown is a 44 y.o. female who presents with episodic symptoms of speech disturbances, blurry vision, weakness and involuntary movements.  On exam there is slow jerky movements and a bouncy gait with astasia abasia. Her involuntary movements her arm were not seen during today's visit. 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     1. The  diagnosis of Psychogenic movement disorder was discussed with the patient. Discussed that this is involuntary and not under the patient's control.   2.   Educational material provided. Referred patient to www.Neurosymptoms.org  3. Recommend working with a counselor to help identify underlying stressors and develop coping mechanisms.   4.  Recommend following with local neurologist regarding follow-up of abnormalities noted on MRI brain.     Return to Clinic as needed    I have personally spent 60 minutes involved in face-to-face and non-face-to-face activities for this patient on the day of the visit.  Professional time spent includes the following activities, in addition to those noted in the documentation: charting, review of records, counseling.       Jasmine Hurter MD    Clinical Assistant Professor  Division of Movement Disorders  Department of Neurology  [facility]    CC:    [place], [place], [place]    Roni Cogan,  [place]        Electronically signed by: Jasmine Hurter, MD  05/26/20 1325

## 2020-08-22 ENCOUNTER — Encounter

## 2021-08-12 ENCOUNTER — Other Ambulatory Visit (INDEPENDENT_AMBULATORY_CARE_PROVIDER_SITE_OTHER): Payer: Self-pay | Admitting: Specialist

## 2021-08-12 ENCOUNTER — Encounter (INDEPENDENT_AMBULATORY_CARE_PROVIDER_SITE_OTHER): Payer: Self-pay

## 2021-08-12 DIAGNOSIS — G43009 Migraine without aura, not intractable, without status migrainosus: Secondary | ICD-10-CM

## 2021-08-12 DIAGNOSIS — G43109 Migraine with aura, not intractable, without status migrainosus: Secondary | ICD-10-CM

## 2021-12-08 ENCOUNTER — Ambulatory Visit (INDEPENDENT_AMBULATORY_CARE_PROVIDER_SITE_OTHER): Payer: MEDICAID | Admitting: NURSE PRACTITIONER

## 2022-02-17 ENCOUNTER — Other Ambulatory Visit: Payer: Self-pay

## 2022-03-27 ENCOUNTER — Ambulatory Visit (HOSPITAL_COMMUNITY): Payer: Self-pay | Admitting: HEMATOLOGY-ONCOLOGY

## 2022-04-13 ENCOUNTER — Other Ambulatory Visit (HOSPITAL_COMMUNITY): Payer: Self-pay | Admitting: NURSE PRACTITIONER

## 2022-04-13 DIAGNOSIS — Z1231 Encounter for screening mammogram for malignant neoplasm of breast: Secondary | ICD-10-CM

## 2022-04-21 ENCOUNTER — Other Ambulatory Visit: Payer: MEDICAID | Attending: Obstetrics & Gynecology | Admitting: Obstetrics & Gynecology

## 2022-04-21 DIAGNOSIS — N870 Mild cervical dysplasia: Secondary | ICD-10-CM | POA: Insufficient documentation

## 2022-04-21 DIAGNOSIS — R87619 Unspecified abnormal cytological findings in specimens from cervix uteri: Secondary | ICD-10-CM

## 2022-04-25 ENCOUNTER — Inpatient Hospital Stay (HOSPITAL_BASED_OUTPATIENT_CLINIC_OR_DEPARTMENT_OTHER)
Admission: RE | Admit: 2022-04-25 | Discharge: 2022-04-25 | Disposition: A | Discharge status: Home / Self Care | Payer: MEDICAID | Source: Ambulatory Visit | Attending: NURSE PRACTITIONER | Admitting: NURSE PRACTITIONER

## 2022-04-25 ENCOUNTER — Ambulatory Visit: Payer: MEDICAID | Attending: HEMATOLOGY-ONCOLOGY | Admitting: HEMATOLOGY-ONCOLOGY

## 2022-04-25 ENCOUNTER — Encounter (HOSPITAL_COMMUNITY): Payer: Self-pay | Admitting: HEMATOLOGY-ONCOLOGY

## 2022-04-25 ENCOUNTER — Other Ambulatory Visit: Payer: Self-pay

## 2022-04-25 ENCOUNTER — Encounter (HOSPITAL_COMMUNITY): Payer: Self-pay

## 2022-04-25 VITALS — BP 109/75 | HR 85 | Temp 98.1°F | Ht 59.0 in | Wt 108.0 lb

## 2022-04-25 DIAGNOSIS — D751 Secondary polycythemia: Secondary | ICD-10-CM

## 2022-04-25 DIAGNOSIS — R799 Abnormal finding of blood chemistry, unspecified: Secondary | ICD-10-CM | POA: Insufficient documentation

## 2022-04-25 DIAGNOSIS — Z1231 Encounter for screening mammogram for malignant neoplasm of breast: Secondary | ICD-10-CM | POA: Insufficient documentation

## 2022-04-25 DIAGNOSIS — F17210 Nicotine dependence, cigarettes, uncomplicated: Secondary | ICD-10-CM | POA: Insufficient documentation

## 2022-04-25 DIAGNOSIS — R7989 Other specified abnormal findings of blood chemistry: Secondary | ICD-10-CM | POA: Insufficient documentation

## 2022-04-25 DIAGNOSIS — D72829 Elevated white blood cell count, unspecified: Secondary | ICD-10-CM | POA: Insufficient documentation

## 2022-04-25 DIAGNOSIS — Z809 Family history of malignant neoplasm, unspecified: Secondary | ICD-10-CM | POA: Insufficient documentation

## 2022-04-25 DIAGNOSIS — N870 Mild cervical dysplasia: Secondary | ICD-10-CM

## 2022-04-25 DIAGNOSIS — Z6821 Body mass index (BMI) 21.0-21.9, adult: Secondary | ICD-10-CM

## 2022-04-25 DIAGNOSIS — D582 Other hemoglobinopathies: Secondary | ICD-10-CM | POA: Insufficient documentation

## 2022-04-25 LAB — SURGICAL PATHOLOGY SPECIMEN: Clinical History: ABNORMAL

## 2022-04-25 NOTE — Progress Notes (Signed)
Department of Hematology/Oncology  History and Physical    Name: Jasmine Brown  AJO:[id number]  Date of Birth: 1976-03-28  Encounter Date: 04/25/2022    REFERRING PROVIDER:  Zadie Rhine, [address]  [place],  [place] [id number]    REASON FOR OFFICE VISIT:  New patient for evaluation and management of abnormal laboratory studies    HISTORY OF PRESENT ILLNESS:  Jasmine Brown is a 46 y.o. female who presented to the hospital for evaluation of "abnormal laboratory studies".  Hematology/oncology consultation was requested for evaluation.  I did not have any laboratory studies in the patient's chart, although she was able to pull them up on her phone.  In summary, the patient has an elevated total white blood cell count, slightly elevated hemoglobin level, and an elevated ferritin level in the presence of a normal iron% saturation.    The patient has numerous additional medical problems for which she is seeing other providers.  She is been seeing a neurologist for possible multiple sclerosis and has had many laboratory studies performed to evaluate for cognitive issues.    ROS: As discussed in HPI. All other systems reviewed and are negative.     HISTORY:  Past Medical History:   Diagnosis Date   . Anemia    . Anxiety state    . Breast mass     Multiple masses due to have a repeat 6 month Mammo 5/23   . Depression    . High cholesterol    . HPV (human papilloma virus) infection    . Migraines    . Multiple lesions on CT of brain and spine    . Neuropathy (CMS HCC)     tendons and muscles are fading away   . Seizures (CMS HCC)    . Stroke (CMS [facility])          Past Surgical History:   Procedure Laterality Date   . DENTAL SURGERY     . HX DILATION AND CURETTAGE           Social History     Socioeconomic History   . Marital status: Married     Spouse name: Latondra Gebhart   . Number of children: 2   . Years of education: 21   . Highest education level: Not on file   Occupational History   . Occupation: Real Estate   Tobacco  Use   . Smoking status: Every Day     Packs/day: 1.00     Types: Cigarettes   . Smokeless tobacco: Never   Vaping Use   . Vaping Use: Every day   . Substances: THC   Substance and Sexual Activity   . Alcohol use: Not Currently   . Drug use: Yes     Types: Marijuana   . Sexual activity: Not on file   Other Topics Concern   . Not on file   Social History Narrative   . Not on file     Social Determinants of Health     Financial Resource Strain: Not on file   Transportation Needs: Not on file   Social Connections: Not on file   Intimate Partner Violence: Not on file   Housing Stability: Not on file     Family Medical History:     Problem Relation (Age of Onset)    Cancer Paternal Uncle    Diabetes Mother, Paternal Grandmother    Heart Attack Maternal Grandfather, Paternal Grandfather    Hypertension (High Blood Pressure)  Mother    Mental illness Mother    No Known Problems Father    Other Maternal Grandfather, Paternal Grandmother    Seizures Maternal Grandmother    Stroke Mother, Maternal Grandmother, Maternal Grandfather    Tremor Mother          Current Outpatient Medications   Medication Sig   . aspirin (ECOTRIN) 81 mg Oral Tablet, Delayed Release (E.C.) Take 1 Tablet (81 mg total) by mouth Once a day   . baclofen (LIORESAL) 10 mg Oral Tablet Take 1 Tablet (10 mg total) by mouth Three times a day   . clobetasoL (TEMOVATE) 0.05 % Cream APPLY TO THE AFFECTED AREA TWICE DAILY   . Colestipol (COLESTID) 1 gram Oral Tablet Take 1 Tablet (1 g total) by mouth Twice daily   . cyanocobalamin, vitamin B-12, (VITAMIN B12 ORAL) Take by mouth   . DULoxetine (CYMBALTA DR) 60 mg Oral Capsule, Delayed Release(E.C.) Take 1 Capsule (60 mg total) by mouth Once a day   . ezetimibe (ZETIA) 10 mg Oral Tablet Take 1 Tablet (10 mg total) by mouth Every night   . lidocaine (XYLOCAINE) 2 % Mucous Membrane jelly lidocaine HCl 2 % mucosal jelly   Apply to affected area 3-4 times daily as needed.   Marland Kitchen LORazepam (ATIVAN) 2 mg Oral Tablet TAKE 1/2  TABLET BY MOUTH TWICE DAILY   . magnesium oxide 400 mg magnesium Oral Tablet Take 1 Tab (400 mg total) by mouth Once a day   . mupirocin (BACTROBAN) 2 % Ointment APPLY A SMALL AMOUNT TO LABIA 2-3 TIMES AS NEEDED   . omeprazole (PRILOSEC) 20 mg Oral Capsule, Delayed Release(E.C.) Take 1 Capsule (20 mg total) by mouth Once a day   . spironolactone (ALDACTONE) 100 mg Oral Tablet Take 1 Tablet (100 mg total) by mouth Every morning with breakfast   . topiramate (TOPAMAX) 100 mg Oral Tablet TAKE 3 TABLETS BY MOUTH EVERY NIGHT AT BEDTIME     Allergies   Allergen Reactions   . Aimovig Autoinjector [Erenumab-Aooe]    . Bactrim [Sulfamethoxazole-Trimethoprim]    . Codeine    . Furosemide    . Hctz [Hydrochlorothiazide]    . Maxidex [Dexamethasone]    . Sulfa (Sulfonamides)    . Triamterene    . Trimethoprim        PHYSICAL EXAM:  Most Recent Vitals    Flowsheet Row Office Visit from 04/25/2022 in Hematology/Oncology,   [facility]   Temperature 36.7 C (98.1 F) filed at... 04/25/2022 1418   Heart Rate 85 filed at... 04/25/2022 1418   Respiratory Rate --   BP (Non-Invasive) 109/75 filed at... 04/25/2022 1418   SpO2 --   Height 1.499 m ('4\' 11"'$ ) filed at... 04/25/2022 1418   Weight 49 kg (108 lb) filed at... 04/25/2022 1418   BMI (Calculated) 21.86 filed at... 04/25/2022 1418   BSA (Calculated) 1.43 filed at... 04/25/2022 1418      ECOG Status: 1  Physical Exam  Constitutional:       General: She is not in acute distress.     Appearance: Normal appearance.   Eyes:      Extraocular Movements: Extraocular movements intact.   Cardiovascular:      Rate and Rhythm: Normal rate and regular rhythm.   Pulmonary:      Effort: Pulmonary effort is normal.   Abdominal:      General: Abdomen is flat.      Palpations: Abdomen is soft.   Musculoskeletal:  General: Normal range of motion.      Cervical back: Normal range of motion.   Skin:     General: Skin is warm and dry.   Neurological:      General: No focal deficit  present.      Mental Status: She is alert.   Psychiatric:         Mood and Affect: Mood normal.         DIAGNOSTIC DATA:  No images are attached to the encounter.     LABS:   BASIC METABOLIC PANEL  Lab Results   Component Value Date    SODIUM 137 08/15/2019    POTASSIUM 3.6 08/15/2019    CHLORIDE 109 08/15/2019    CO2 19 (L) 08/15/2019    ANIONGAP 9 08/15/2019    BUN 10 08/15/2019    CREATININE 0.83 08/15/2019    BUNCRRATIO 12 08/15/2019    GFR >60 08/15/2019    CALCIUM 8.7 08/15/2019    GLUCOSENF 83 08/15/2019        CBC  Diff   Lab Results   Component Value Date/Time    WBC 10.3 09/18/2019 11:11 PM    HGB 12.8 09/18/2019 11:11 PM    HCT 36.2 09/18/2019 11:11 PM    PLTCNT 203 09/18/2019 11:11 PM    RBC 3.73 (L) 09/18/2019 11:11 PM    MCV 97.1 09/18/2019 11:11 PM    MCHC 35.4 09/18/2019 11:11 PM    MCH 34.3 (H) 09/18/2019 11:11 PM    MPV 11.1 09/18/2019 11:11 PM    Lab Results   Component Value Date/Time    PMNS 32 09/18/2019 11:11 PM    LYMPHOCYTES 58 09/18/2019 11:11 PM    EOSINOPHIL 3 09/18/2019 11:11 PM    MONOCYTES 6 09/18/2019 11:11 PM    BASOPHILS 0.10 09/18/2019 11:11 PM    BASOPHILS 1 09/18/2019 11:11 PM    PMNABS 3.30 09/18/2019 11:11 PM    LYMPHSABS 4.83 (H) 08/15/2019 03:43 PM    EOSABS 0.31 09/18/2019 11:11 PM    MONOSABS 0.62 09/18/2019 11:11 PM            ASSESSMENT:  No diagnosis found.     PLAN:   1. All relevant medical records were reviewed including available pertinent provider notes, procedure notes, imaging, laboratory, and pathology.   2. All pertinent labs and/or imaging were reviewed with the patient.   3. Elevated white blood cell count: The patient is an active smoker, and the elevation in her white blood cell count is likely from the use of tobacco products.  The magnitude of the abnormality and the differential are consistent with this as the most likely cause.  4. Elevated ferritin level:  The patient's% saturation is normal, so I suspect that the ferritin is elevated because of  increased hepcidin which occurs in the presence of inflammation.  The other term for this is anemia of chronic disease.  5. Elevated hemoglobin level:  Her hemoglobin level is slightly above normal.  Due to the magnitude of her use of tobacco products, I suspect that she has an increase due to carbon monoxide.  Lung disease would be another possibility, but this is slightly less likely because of the patient's age.  6. Disposition:  I reviewed all of the above information with the patient and she was reassured.  I offered to check additional laboratory studies for peace of mind, but she would like to avoid doing that at this time.  She is therefore clear  to follow up with her primary physician and we will be available on an as-needed basis.    Jasmine Brown was given the chance to ask questions, and these were answered to their satisfaction. The patient is welcome to call with any questions or concerns in the meantime.   On the day of the encounter, a total of 45 minutes was spent on this patient encounter including review of historical information, examination, documentation and post-visit activities.   No follow-ups on file.     Narda Rutherford, MD  04/25/2022, 15:17      CC:  Zadie Rhine, [place] [id number]    Zadie Rhine, [place]  [place],  [place] [id number]    This note was partially generated using MModal Fluency Direct system, and there may be some incorrect words, spellings, and punctuation that were not noted in checking the note before saving.

## 2022-05-02 ENCOUNTER — Ambulatory Visit (HOSPITAL_COMMUNITY): Payer: Self-pay | Admitting: NURSE PRACTITIONER

## 2022-06-14 ENCOUNTER — Other Ambulatory Visit: Payer: Self-pay

## 2022-06-20 ENCOUNTER — Inpatient Hospital Stay
Admission: RE | Admit: 2022-06-20 | Discharge: 2022-06-20 | Disposition: A | Discharge status: Home / Self Care | Payer: MEDICAID | Source: Ambulatory Visit | Attending: Obstetrics & Gynecology | Admitting: Obstetrics & Gynecology

## 2022-06-20 ENCOUNTER — Ambulatory Visit (HOSPITAL_COMMUNITY): Payer: MEDICAID | Admitting: Anesthesiology

## 2022-06-20 ENCOUNTER — Encounter (HOSPITAL_COMMUNITY): Payer: Self-pay | Admitting: Obstetrics & Gynecology

## 2022-06-20 ENCOUNTER — Other Ambulatory Visit: Payer: Self-pay

## 2022-06-20 ENCOUNTER — Encounter (HOSPITAL_COMMUNITY)
Admission: RE | Disposition: A | Discharge status: Home / Self Care | Payer: Self-pay | Source: Ambulatory Visit | Attending: Obstetrics & Gynecology

## 2022-06-20 DIAGNOSIS — N900 Mild vulvar dysplasia: Secondary | ICD-10-CM | POA: Insufficient documentation

## 2022-06-20 DIAGNOSIS — N870 Mild cervical dysplasia: Secondary | ICD-10-CM | POA: Insufficient documentation

## 2022-06-20 HISTORY — DX: Other muscle spasm: M62.838

## 2022-06-20 LAB — HCG, URINE QUALITATIVE, PREGNANCY: HCG URINE QUALITATIVE: NEGATIVE

## 2022-06-20 SURGERY — BIOPSY VULVA
Anesthesia: Monitor Anesthesia Care | Site: Vulva | Wound class: Clean Contaminated Wounds-The respiratory, GI, Genital, or urinary

## 2022-06-20 MED ORDER — SODIUM CHLORIDE 0.9 % (FLUSH) INJECTION SYRINGE
3.0000 mL | INJECTION | Freq: Three times a day (TID) | INTRAMUSCULAR | Status: DC
Start: 2022-06-20 — End: 2022-06-20

## 2022-06-20 MED ORDER — LACTATED RINGERS INTRAVENOUS SOLUTION
INTRAVENOUS | Status: DC
Start: 2022-06-20 — End: 2022-06-20
  Administered 2022-06-20: 0 via INTRAVENOUS

## 2022-06-20 MED ORDER — DEXMEDETOMIDINE 100 MCG/ML INTRAVENOUS SOLUTION
Freq: Once | INTRAVENOUS | Status: DC | PRN
Start: 2022-06-20 — End: 2022-06-20
  Administered 2022-06-20: 4 ug via INTRAVENOUS
  Administered 2022-06-20: 12 ug via INTRAVENOUS

## 2022-06-20 MED ORDER — ONDANSETRON HCL (PF) 4 MG/2 ML INJECTION SOLUTION
4.0000 mg | Freq: Once | INTRAMUSCULAR | Status: DC | PRN
Start: 2022-06-20 — End: 2022-06-20

## 2022-06-20 MED ORDER — ROPIVACAINE (PF) 2 MG/ML (0.2 %) INJECTION SOLUTION
INTRAMUSCULAR | Status: AC
Start: 2022-06-20 — End: 2022-06-20
  Filled 2022-06-20: qty 10

## 2022-06-20 MED ORDER — HYDROMORPHONE 2 MG/ML INJECTION WRAPPER
1.0000 mg | INJECTION | INTRAMUSCULAR | Status: DC | PRN
Start: 2022-06-20 — End: 2022-06-20

## 2022-06-20 MED ORDER — MIDAZOLAM 5 MG/ML INJECTION WRAPPER
1.5000 mg | Freq: Once | INTRAMUSCULAR | Status: DC | PRN
Start: 2022-06-20 — End: 2022-06-20
  Administered 2022-06-20: 1.5 mg via INTRAVENOUS

## 2022-06-20 MED ORDER — SODIUM CHLORIDE 0.9 % (FLUSH) INJECTION SYRINGE
3.0000 mL | INJECTION | INTRAMUSCULAR | Status: DC | PRN
Start: 2022-06-20 — End: 2022-06-20

## 2022-06-20 MED ORDER — ONDANSETRON HCL (PF) 4 MG/2 ML INJECTION SOLUTION
4.0000 mg | Freq: Once | INTRAMUSCULAR | Status: AC
Start: 2022-06-20 — End: 2022-06-20
  Administered 2022-06-20: 4 mg via INTRAVENOUS

## 2022-06-20 MED ORDER — PROCHLORPERAZINE EDISYLATE 10 MG/2 ML (5 MG/ML) INJECTION SOLUTION
10.0000 mg | Freq: Four times a day (QID) | INTRAMUSCULAR | Status: DC | PRN
Start: 2022-06-20 — End: 2022-06-20

## 2022-06-20 MED ORDER — DEXMEDETOMIDINE 100 MCG/ML INTRAVENOUS SOLUTION
INTRAVENOUS | Status: AC
Start: 2022-06-20 — End: 2022-06-20
  Filled 2022-06-20: qty 2

## 2022-06-20 MED ORDER — IPRATROPIUM 0.5 MG-ALBUTEROL 3 MG (2.5 MG BASE)/3 ML NEBULIZATION SOLN
3.0000 mL | INHALATION_SOLUTION | Freq: Once | RESPIRATORY_TRACT | Status: DC | PRN
Start: 2022-06-20 — End: 2022-06-20

## 2022-06-20 MED ORDER — MIDAZOLAM 5 MG/ML INJECTION WRAPPER
INTRAMUSCULAR | Status: AC
Start: 2022-06-20 — End: 2022-06-20
  Filled 2022-06-20: qty 1

## 2022-06-20 MED ORDER — OXYCODONE-ACETAMINOPHEN 5 MG-325 MG TABLET
2.0000 | ORAL_TABLET | ORAL | Status: DC | PRN
Start: 2022-06-20 — End: 2022-06-20

## 2022-06-20 MED ORDER — KETOROLAC 30 MG/ML (1 ML) INJECTION SOLUTION
15.0000 mg | Freq: Four times a day (QID) | INTRAMUSCULAR | Status: DC | PRN
Start: 2022-06-20 — End: 2022-06-20

## 2022-06-20 MED ORDER — FENTANYL (PF) 50 MCG/ML INJECTION WRAPPER
25.0000 ug | INJECTION | INTRAMUSCULAR | Status: DC | PRN
Start: 2022-06-20 — End: 2022-06-20

## 2022-06-20 MED ORDER — FENTANYL (PF) 50 MCG/ML INJECTION WRAPPER
50.0000 ug | INJECTION | INTRAMUSCULAR | Status: DC | PRN
Start: 2022-06-20 — End: 2022-06-20

## 2022-06-20 MED ORDER — FENTANYL (PF) 50 MCG/ML INJECTION SOLUTION
INTRAMUSCULAR | Status: AC
Start: 2022-06-20 — End: 2022-06-20
  Filled 2022-06-20: qty 2

## 2022-06-20 MED ORDER — OXYCODONE-ACETAMINOPHEN 5 MG-325 MG TABLET
1.0000 | ORAL_TABLET | ORAL | Status: DC | PRN
Start: 2022-06-20 — End: 2022-06-20

## 2022-06-20 MED ORDER — LACTATED RINGERS INTRAVENOUS SOLUTION
INTRAVENOUS | Status: DC
Start: 2022-06-20 — End: 2022-06-20

## 2022-06-20 MED ORDER — ONDANSETRON HCL (PF) 4 MG/2 ML INJECTION SOLUTION
INTRAMUSCULAR | Status: AC
Start: 2022-06-20 — End: 2022-06-20
  Filled 2022-06-20: qty 2

## 2022-06-20 MED ORDER — FENTANYL (PF) 50 MCG/ML INJECTION WRAPPER
INJECTION | Freq: Once | INTRAMUSCULAR | Status: DC | PRN
Start: 2022-06-20 — End: 2022-06-20
  Administered 2022-06-20 (×2): 25 ug via INTRAVENOUS
  Administered 2022-06-20: 50 ug via INTRAVENOUS

## 2022-06-20 MED ORDER — LIDOCAINE 1 %-EPINEPHRINE 1:100,000 INJECTION SOLUTION
Freq: Once | INTRAMUSCULAR | Status: DC | PRN
Start: 2022-06-20 — End: 2022-06-20
  Administered 2022-06-20: 3 mL via INTRAMUSCULAR

## 2022-06-20 MED ORDER — LIDOCAINE (PF) 100 MG/5 ML (2 %) INTRAVENOUS SYRINGE
INJECTION | Freq: Once | INTRAVENOUS | Status: DC | PRN
Start: 2022-06-20 — End: 2022-06-20
  Administered 2022-06-20: 50 mg via INTRAVENOUS

## 2022-06-20 MED ORDER — PROPOFOL 10 MG/ML INTRAVENOUS EMULSION
INTRAVENOUS | Status: DC | PRN
Start: 2022-06-20 — End: 2022-06-20
  Administered 2022-06-20: 50 ug/kg/min via INTRAVENOUS
  Administered 2022-06-20: 0 ug/kg/min via INTRAVENOUS

## 2022-06-20 MED ORDER — ROPIVACAINE (PF) 2 MG/ML (0.2 %) INJECTION SOLUTION
Freq: Once | INTRAMUSCULAR | Status: DC | PRN
Start: 2022-06-20 — End: 2022-06-20
  Administered 2022-06-20: 10 mL via INTRAMUSCULAR

## 2022-06-20 MED ORDER — ALBUTEROL SULFATE 2.5 MG/3 ML (0.083 %) SOLUTION FOR NEBULIZATION
2.5000 mg | INHALATION_SOLUTION | Freq: Once | RESPIRATORY_TRACT | Status: DC | PRN
Start: 2022-06-20 — End: 2022-06-20

## 2022-06-20 MED ORDER — FAMOTIDINE (PF) 20 MG/2 ML INTRAVENOUS SOLUTION
INTRAVENOUS | Status: AC
Start: 2022-06-20 — End: 2022-06-20
  Filled 2022-06-20: qty 2

## 2022-06-20 MED ORDER — FAMOTIDINE (PF) 20 MG/2 ML INTRAVENOUS SOLUTION
20.0000 mg | Freq: Once | INTRAVENOUS | Status: AC
Start: 2022-06-20 — End: 2022-06-20
  Administered 2022-06-20: 20 mg via INTRAVENOUS

## 2022-06-20 MED ORDER — MIDAZOLAM 5 MG/ML INJECTION WRAPPER
Freq: Once | INTRAMUSCULAR | Status: DC | PRN
Start: 2022-06-20 — End: 2022-06-20
  Administered 2022-06-20: 2 mg via INTRAVENOUS
  Administered 2022-06-20: 1 mg via INTRAVENOUS

## 2022-06-20 MED ORDER — ONDANSETRON HCL (PF) 4 MG/2 ML INJECTION SOLUTION
4.0000 mg | INTRAMUSCULAR | Status: DC | PRN
Start: 2022-06-20 — End: 2022-06-20

## 2022-06-20 SURGICAL SUPPLY — 73 items
BAG BIOHAZ RD 30X24IN THK3 MIL C8-10GL LLDPE INFCT WASTE CAN (MED SURG SUPPLIES) ×1
BAG BIOHAZ RD 30X24IN THK3 MIL C8-10GL LLDPE INFCT WASTE CAN PNCT RST (MED SURG SUPPLIES) ×2
BLADE 11 2 END CBNSTL SURG STRL DISP (CUTTING ELEMENTS) ×1
BLADE 11 2 END CBNSTL SURG STRL DISP (SURGICAL CUTTING SUPPLIES) ×2 IMPLANT
CATH URETH CLEAN 14FR 16IN UNIV INTMT SMOOTH TIP FNL END STGR EYE VNYL STRL LF  CLR (UROLOGICAL SUPPLIES) ×2 IMPLANT
CATH URETH CLEAN 14FR 16IN UNI_V INTMT SMOOTH TIP FNL END (UROLOGICAL SUPPLIES) ×1
CLEANER INSTR PREPZYME MUL-TRD CONTAINR NARSL NEUT PH BDGR (MISCELLANEOUS PT CARE ITEMS) ×1
CONV USE 102436 - NEEDLE HYPO  22GA 1.5IN STD MONOJECT SS POLYPROP REG BVL LL HUB UL SHRP ANTICORE BLU STRL LF  DISP (MED SURG SUPPLIES) ×2 IMPLANT
CONV USE 339048 - COVER TBL 90X50IN STD SMS REINF FNFLD STRL LF  DISP (DRAPE/PACKS/SHEETS/OR TOWEL) ×2
CONV USE 404705 - PACK SURG SIRUS OB III REINF TBL CVR GRAD UNDERBUTTOCK STRL 76X44IN 46X33.5IN POLY LF (MED SURG SUPPLIES) ×2
CONV USE ITEM 321852 - GLOVE SURG 6.5 LF  PLISPRN (GLOVES AND ACCESSORIES) IMPLANT
CONV USE ITEM 321854 - GLOVE SURG 6 LF  BEAD CUF SMOOTH HI GRIP WHT 12IN MDCHC PLISPRN (GLOVES AND ACCESSORIES) ×6 IMPLANT
CONV USE ITEM 321982 - GLOVE SURG 7 LTX CHEMO PF SMOOTH BEAD CUF STRL WHT 11.6IN PLMR THK.2MM THK.21MM (GLOVES AND ACCESSORIES) IMPLANT
CONV USE ITEM 321983 - GLOVE SURG 8 LTX CHEMO PF SMOOTH BEAD CUF STRL WHT 11.6IN PLMR THK.2MM THK.21MM (GLOVES AND ACCESSORIES) ×2 IMPLANT
CONV USE ITEM 323185 - PAD EG 15SQ IN UNIV FOAM SPLT NONCORD ADULT 9100 SER (SURGICAL CUTTING SUPPLIES) ×2 IMPLANT
CONV USE ITEM 329146 - CLEANER INSTR PREPZYME MUL-TRD CONTAINR NARSL NEUT PH BDGR 22OZ (MISCELLANEOUS PT CARE ITEMS) ×2 IMPLANT
CONV USE ITEM 34153 - ELECTRODE ESURG BLADE PNCL 3/32IN STRL SS CAUT PSHBTN STD SHAFT LF  VEGA SER (SURGICAL CUTTING SUPPLIES) ×2 IMPLANT
CONV USE ITEM 81225 - BAG BIOHAZ RD 30X24IN THK3 MIL C8-10GL LLDPE INFCT WASTE CAN PNCT RST (MED SURG SUPPLIES) ×2 IMPLANT
COUNTER 20 CNT BLOCK ADH NEEDLE STRL LF  RD SHARP FOAM 15.75X11.5X14IN DISP (MED SURG SUPPLIES) ×2 IMPLANT
COUNTER 20 CNT BLOCK ADH NEEDLE STRL LF RD SHARP FOAM 15.75 (MED SURG SUPPLIES) ×2
COVER TBL 90X50IN STD SMS REINF FNFLD STRL LF  DISP (DRAPE/PACKS/SHEETS/OR TOWEL) ×2 IMPLANT
COVER TBL 90X50IN STD SMS REINF FNFLD STRL LF DISP (DRAPE/PACKS/SHEETS/OR TOWEL) ×1
DISCONTINUED USE 162518 - PAD SANITARY CURITY HVY ABS MOIST BARRIER NWVN CVR MATERNITY SILK FLF DISP LF  NONST 12.25X4.25IN (MED SURG SUPPLIES) ×2 IMPLANT
ELECTRODE ESURG BLADE PNCL 3/32IN STRL SS CAUT PSHBTN STD (CUTTING ELEMENTS) ×1
ELECTRODE ESURG MED FSCHR STRL DISP CERV CONE BIOPSY EXCISOR (MED SURG SUPPLIES) ×2 IMPLANT
ELECTRODE ESURG NEEDLE STD 2.84IN 2.4MM SS LF (CUTTING ELEMENTS) ×1
ELECTRODE ESURG NEEDLE STD 2.84IN 2.4MM SS LF (SURGICAL CUTTING SUPPLIES) ×2 IMPLANT
EXCISOR BIOPSY .59X.472IN FSCH_77D 15MM MED CONE DIST TIP (MED SURG SUPPLIES) ×1
GLOVE SURG 6 LF PF SMOOTH STRL WHT PLISPRN (GLOVES AND ACCESSORIES) ×3
GLOVE SURG 6.5 LF PF SMOOTH STRL WHT PLISPRN (GLOVES AND ACCESSORIES)
GLOVE SURG 6.5 LTX PF BEAD CUF MICRO ROUGHEN N-PYRG STRL STRW BGL SRG CURVE FINGER (GLOVES AND ACCESSORIES) ×2 IMPLANT
GLOVE SURG 6.5 LTX PF BEAD CUF_MICRO RGH N-PYRG STRL STRW (GLOVES AND ACCESSORIES) ×1
GLOVE SURG 7 LF  PF SMOOTH TXTR BEAD CUF STRL GRN 12IN SENSICARE PI PLISPRN SYN PLMR ALOE THK7.9 MIL (GLOVES AND ACCESSORIES) ×4 IMPLANT
GLOVE SURG 7 LF  PF STRL PLISPRN DISP (GLOVES AND ACCESSORIES) IMPLANT
GLOVE SURG 7 LF PF SMOOTH STRL WHT PLISPRN (GLOVES AND ACCESSORIES)
GLOVE SURG 7 LF PF SMOOTH TXTR BEAD CUF STRL GRN 12IN (GLOVES AND ACCESSORIES) ×2
GLOVE SURG 7 LTX CHEMO PF SMOOTH BEAD CUF STRL WHT 11.6IN PLMR THK.2MM THK.21MM (GLOVES AND ACCESSORIES)
GLOVE SURG 7 LTX PF SMOOTH STRL CRM (GLOVES AND ACCESSORIES)
GLOVE SURG 7.5 LF PF SMOOTH STRL WHT PLISPRN (GLOVES AND ACCESSORIES) ×1
GLOVE SURG 8 LTX PF SMOOTH STRL CRM (GLOVES AND ACCESSORIES) ×1
GLOVE SURG LF  PF STRL 7.5 PLISPRN DISP (GLOVES AND ACCESSORIES) ×2 IMPLANT
GOWN SURG 2XL XLNG LGTH L3 HKLP CLSR RGLN SLEEVE TWL STRL LF (DRAPE/PACKS/SHEETS/OR TOWEL) ×1
GOWN SURG 2XL XLNG LGTH L3 HKLP CLSR RGLN SLEEVE TWL STRL LF  DISP GRN AERO BLU PRFRM FBRC (DRAPE/PACKS/SHEETS/OR TOWEL) ×2 IMPLANT
GOWN SURG LRG STD LGTH REG L3 NONREINFORCE BRTHBL TWL STRL (DRAPE/PACKS/SHEETS/OR TOWEL) ×1
GOWN SURG LRG STD LGTH REG L3 NONREINFORCE BRTHBL TWL STRL LF  DISP BLU HALYARD SPECTRUM SMS (DRAPE/PACKS/SHEETS/OR TOWEL) ×2 IMPLANT
GOWN SURG XL STD LGTH L3 NONREINFORCE HKLP CLSR TWL STRL LF (DRAPE/PACKS/SHEETS/OR TOWEL) ×2
GOWN SURG XL STD LGTH L3 NONREINFORCE HKLP CLSR TWL STRL LF  DISP BLU SPECTRUM SMS (DRAPE/PACKS/SHEETS/OR TOWEL) ×4
GOWN SURG XL STD LGTH L3 NONREINFORCE HKLP CLSR TWL STRL LF DISP BLU SPECTRUM SMS (DRAPE/PACKS/SHEETS/OR TOWEL) ×4 IMPLANT
HDPE THK22 UM C40-45 GL L48 IN X W40 IN NATURAL (MISCELLANEOUS PT CARE ITEMS) ×3 IMPLANT
LABEL MED CORRECT MED LABELING SYS 4 FLG 2 SHEET 24 PRPRNT (MED SURG SUPPLIES) ×1
LABEL MED CORRECT MED LABELING SYS 4 FLG 2 SHEET 24 PRPRNT STRL (MED SURG SUPPLIES) ×2 IMPLANT
LINER SUCT MEDIVAC CRD TW LOCK LID SHTOF VALVE CAN PORT 3L LF  DISP (MED SURG SUPPLIES) ×2 IMPLANT
LINER SUCT MEDIVAC CRD TW LOCK_LID SHTOF VALVE CAN PORT 3L (MED SURG SUPPLIES) ×1
NEEDLE HYPO 22GA 1.5IN STD MONOJECT SS POLYPROP REG BVL LL (MED SURG SUPPLIES) ×2
NEEDLE SPINAL YW 3.5IN 20GA QUINCKE REG WL POLYPROP STRL LF  DISP (MED SURG SUPPLIES) ×2 IMPLANT
NEEDLE SPINAL YW 3.5IN 20GA QU_INCKE REG WL POLYPROP REG BVL (MED SURG SUPPLIES) ×1
PACK SURG BSC VAG DEL LF (MED SURG SUPPLIES) ×1
PACK SURG SIRUS OB III REINF TBL CVR GRAD UNDERBUTTOCK STRL 76X44IN 46X33.5IN POLY LF (MED SURG SUPPLIES) ×2 IMPLANT
PAD EG 15SQ IN UNIV FOAM SPLT NONCORD ADULT 9100 SER (CUTTING ELEMENTS) ×2
PAD OB BULK 2022A_14EA/PK 12PK/CS (MED SURG SUPPLIES) ×1
PAD SANITARY CURITY HVY ABS MOIST BARRIER NWVN CVR MATERNITY SILK FLF DISP LF  NONST 12.25X4.25IN (MED SURG SUPPLIES) ×2
SPONGE SURG 4X4IN 16 PLY XRY DETECT COTTON STRL LF  DISP (WOUND CARE SUPPLY) ×2 IMPLANT
SPONGE SURG 4X4IN 16 PLY_RADOPQ COT STRL LF DISP (WOUND CARE/ENTEROSTOMAL SUPPLY) ×1
SUTURE 0 GS-21 POLYSRB 36IN VIOL BRD COAT ABS (SUTURE/WOUND CLOSURE) ×3 IMPLANT
SUTURE 0 GS-21 POLYSRB D-TACH 18IN VIOL BRD 5 STRN COAT ABS (SUTURE/WOUND CLOSURE) ×3 IMPLANT
SUTURE 4-0 V-20 POLYSRB 30IN VIOL BRD COAT ABS (SUTURE/WOUND CLOSURE) ×3 IMPLANT
SYRINGE LL 10ML LF  STRL GRAD N-PYRG DEHP-FR PVC FREE MED DISP (MED SURG SUPPLIES) ×4 IMPLANT
SYRINGE LL 10ML LF STRL MED D_ISP (MED SURG SUPPLIES) ×2
TOWEL 24X16IN COTTON BLU DISP SURG STRL LF (DRAPE/PACKS/SHEETS/OR TOWEL) ×6 IMPLANT
TUBE BUBBLE CONNECTING_8888280214 1EA/BX/CS (MED SURG SUPPLIES) ×1
TUBING SUCT CLR 100FT 3/16IN ARGYLE UNIV PVC NCDTV BBL NONST LF (MED SURG SUPPLIES) ×2 IMPLANT
TUBING SUCT CLR 6FT .25IN ARGYLE PVC NCDTV STR MALE FEMALE (MED SURG SUPPLIES) ×1
TUBING SUCT CLR 6FT .25IN ARGYLE PVC NCDTV STR MALE FEMALE MLD CONN STRL LF (MED SURG SUPPLIES) ×2 IMPLANT

## 2022-06-20 NOTE — H&P (Signed)
Document sent from office to be scanned in

## 2022-06-20 NOTE — Interval H&P Note (Signed)
[facility]       H&P UPDATE FORM                                                                                    Patient's Name: Jasmine Brown 45 y.o. female  Date of Admission:  06/20/2022  Patient's Date of Birth: [birth year]    06/20/2022       H & P updated the day of the procedure.  1.  H&P completed within 30 days of surgical procedure  and has been reviewed within 24 hours of the surgery, the patient has been examined, and no change has occured in the patients condition since the H&P was completed.       Change in medications: No    2.  Patient continues to be appropriate candidate for planned surgical procedure. YES        Caren Griffins, DO, PhD, Cherlynn June

## 2022-06-20 NOTE — Anesthesia Transfer of Care (Signed)
ANESTHESIA TRANSFER OF CARE   Jasmine Brown is a 46 y.o. ,female, Weight: 48.1 kg (106 lb)   had Procedure(s):  VULVA BIOPSY,  CERVIX CONE BIOPSY  performed  06/20/22   Primary Service: [facility]ier Ensign*    Past Medical History:   Diagnosis Date    Anemia     Anxiety state     Breast mass     Multiple masses due to have a repeat 6 month Mammo 5/23    Depression     High cholesterol     HPV (human papilloma virus) infection     Migraines     Multiple lesions on CT of brain and spine     Muscle spasm     Neuropathy (CMS HCC)     tendons and muscles are fading away    Seizures (CMS HCC)     Stroke (CMS [facility])       Allergy History as of 06/20/22       CODEINE         Noted Status Severity Type Reaction    06/21/16 1529 Orie Fisherman, [place] 06/21/16 Active                 SULFA (SULFONAMIDES)         Noted Status Severity Type Reaction    06/21/16 1529 Orie Fisherman, [place] 06/21/16 Active                 TRIAMTERENE         Noted Status Severity Type Reaction    06/21/16 1529 Orie Fisherman, [place] 06/21/16 Active                 FUROSEMIDE         Noted Status Severity Type Reaction    06/21/16 1529 Orie Fisherman, [place] 06/21/16 Active                 HYDROCHLOROTHIAZIDE         Noted Status Severity Type Reaction    06/21/16 1529 Orie Fisherman, [place] 06/21/16 Active                 TRIMETHOPRIM         Noted Status Severity Type Reaction    06/21/16 1529 Orie Fisherman, [place] 06/21/16 Active                 SULFAMETHOXAZOLE-TRIMETHOPRIM         Noted Status Severity Type Reaction    06/21/16 1530 Orie Fisherman, [place] 06/21/16 Active                 DEXAMETHASONE         Noted Status Severity Type Reaction    06/21/16 1530 Orie Fisherman, [place] 06/21/16 Active                 ERENUMAB-AOOE         Noted Status Severity Type Reaction    03/12/19 1254 Benn Moulder, Ambulatory Care Assistant 03/12/19 Active                     I completed my transfer of care / handoff to the receiving personnel during which we  discussed:  Access, Airway, All key/critical aspects of case discussed, Analgesia, Antibiotics, Expectation of post procedure, Fluids/Product, Gave opportunity for questions and acknowledgement of understanding, Labs and PMHx      Post Location: PACU  Additional Info:Report to RN @ Bedside                                   Last OR Temp: Temperature: 36.9 C (98.5 F)  ABG:  POTASSIUM   Date Value Ref Range Status   08/15/2019 3.6 3.5 - 5.1 mmol/L Final     KAPPA FREE LIGHT CHAIN, CSF   Date Value Ref Range Status   09/20/2019 0.3160 (H) <0.1000 mg/dL Final     Comment:     The kappa free light concentration measured in CSF  is at or greater than the threshold associated with   demyelinating disease. This is a positive result.   These findings, however, are not specific for MS  because CSF-specific immunoglobulin synthesis may  also be detected in patients with other neurologic diseases  (infectious, inflammatory, cerebrovascular, autoimmune,   and paraneoplastic). Clinical correlation recommended.     -------------------ADDITIONAL INFORMATION-------------------  This test has been modified from the manufacturer's   instructions. Its performance characteristics were   determined by [facility] in a manner consistent with   CLIA requirements. This test has not been cleared or   approved by the U.S. Food and Drug Administration.     KETONES   Date Value Ref Range Status   09/20/2019 Negative Negative mg/dL Final     CALCIUM   Date Value Ref Range Status   08/15/2019 8.7 8.5 - 10.2 mg/dL Final     Calculated P Axis   Date Value Ref Range Status   09/18/2019 62 degrees Final     Calculated R Axis   Date Value Ref Range Status   09/18/2019 56 degrees Final     Calculated T Axis   Date Value Ref Range Status   09/18/2019 62 degrees Final     Airway:* No LDAs found *  Blood pressure (!) 89/55, pulse 62, temperature 36.9 C (98.5 F), resp. rate 19, height 1.499 m ('4\' 11"'$ ), weight 48.1 kg (106 lb), SpO2  100 %, not currently breastfeeding.

## 2022-06-20 NOTE — Anesthesia Postprocedure Evaluation (Signed)
Anesthesia Post Op Evaluation    Patient: Jasmine Brown  Procedure(s):  VULVA BIOPSY,  CERVIX CONE BIOPSY    Last Vitals:Temperature: 36.9 C (98.5 F) (06/20/22 0850)  Heart Rate: 62 (06/20/22 0850)  BP (Non-Invasive): (!) 89/55 (06/20/22 0850)  Respiratory Rate: 19 (06/20/22 0850)  SpO2: 100 % (06/20/22 0850)    No notable events documented.    Patient is sufficiently recovered from the effects of anesthesia to participate in the evaluation and has returned to their pre-procedure level.  Patient location during evaluation: PACU       Patient participation: complete - patient participated  Level of consciousness: awake and alert and responsive to verbal stimuli    Pain management: adequate  Airway patency: patent    Anesthetic complications: no  Cardiovascular status: acceptable  Respiratory status: acceptable  Hydration status: acceptable  Patient post-procedure temperature: Pt Normothermic   PONV Status: Absent

## 2022-06-20 NOTE — OR Surgeon (Signed)
[facility]  Operative Note    Patient Name: Jasmine Brown, Jasmine Brown Number: [id number]  Date of Service: 06/20/2022   Date of Birth: [birth year]      Pre-Operative Diagnosis: VULVAR LESION;HIGH GRADE SQUAMOUS INTRAEPITHELIAL CERVICAL LESION     Post-Operative Diagnosis: VULVAR LESION;HIGH GRADE SQUAMOUS INTRAEPITHELIAL CERVICAL LESION    Procedure(s)/Description:  VULVA BIOPSY,: [id number] (CPT)  CERVIX CONE BIOPSY: 57520 (CPT)     Findings: VULVAR LESION;HIGH GRADE SQUAMOUS INTRAEPITHELIAL CERVICAL LESION normal-appearing external female genitalia vaginal mucosa and cervix.  There was a small lesion on the left inner portion of the labia.    Attending Surgeon: Caren Griffins, DO     Anesthesia:  Anesthesiologist: Orvilla Cornwall, DO  CRNA: Donita Brooks, CRNA    Anesthesia Type: .Monitor Anesthesia Care     Estimated Blood Loss:  Minimal    Specimens Removed:   ID Type Source Tests Collected by Time Destination   1 : CONE BIOPSY OF CERVIX Tissue Cervix SURGICAL PATHOLOGY SPECIMEN Caren Griffins, DO 06/20/2022 4315    2 : vulva lesion Tissue Vulva SURGICAL PATHOLOGY SPECIMEN Caren Griffins, DO 06/20/2022 4008       Order Name Source Comment Collection Info Order Time   HCG, URINE QUALITATIVE, PREGNANCY Urine, Site not specified  Collected By: Melinda Crutch, LPN 6/76/1950  9:32 AM     Release to patient   Automated        SURGICAL PATHOLOGY SPECIMEN Cervix Pre-op diagnosis:  VULVAR LESION;HIGH GRADE SQUAMOUS INTRAEPITHELIAL CERVICAL LESION Collected By: Caren Griffins, DO 06/20/2022  8:29 AM     Release to patient   Automated             Complications:  None      Condition: stable    Disposition: PACU - hemodynamically stable.    Description of Procedure:    After confirming the validity of consents, the patient was transported to the operating room and placed on the operating table.  After adequate general anesthesia, the patient's legs were placed in stirrups and she was  sterilely prepped and draped in the usual fashion.  Surgical timeout was performed.  Sims and Deaver were inserted into the vagina to visualize the cervix.  The cervix was then grasped on the anterior lip using an Allis clamp.  The cervix was then tagged at the 3 and 9 o'clock positions with 0 Vicryl.  The cervix was then incised using a large electrocautery biopsy device.  The tissue was then tagged at 12 o'clock with a stitch and passed off for pathologic evaluation.  The cone bed was then cauterized using monopolar electrocautery on a ball electrode.  The cervical bed was noted to be hemostatic and the stay sutures were trimmed.  All instruments were removed.  The vulva was injected underneath the area of excision with 1% lidocaine with a one-to-one 100,000 epinephrine.  It was then excised in an elliptical fashion and tagged at the 12 o'clock position.  The skin was then closed using Vicryl in a running locking fashion with good hemostasis.  All sponge and instrument counts were correct 2.  All specimens were sent for pathologic evaluation.  The patient was awakened and transported to PACU in stable condition.        Caren Griffins D.O., Ph.D., Cherlynn June

## 2022-06-20 NOTE — Discharge Instructions (Signed)
Follow up with Dr. Theodosia Quay on Tuesday, July 04, 2022 at 1:45 p.m.    Call for bleeding greater than 1 pad an hour, foul smelling discharge or fever greater than 100.4.    Call for any problems or concerns.    Pelvic rest until advised by the doctor. No sex, tampons, douches, tub baths, swimming or hot tubs.

## 2022-06-20 NOTE — Anesthesia Preprocedure Evaluation (Addendum)
ANESTHESIA PRE-OP EVALUATION  Planned Procedure: VULVA BIOPSY,  CERVIX CONE BIOPSY (Cervix)  Review of Systems     anesthesia history negative     patient summary reviewed  nursing notes reviewed        Pulmonary   current smoker and past history vaping ,   Cardiovascular  negative cardio ROS,   Patients normal BP is 83-15 systolic. Asymptomatic at all times and hyperlipidemia , Exercise Tolerance: > or = 4 METS        GI/Hepatic/Renal   negative GI/hepatic/renal ROS,         Endo/Other    anemia,      Neuro/Psych/MS    seizures, TIA, headaches, depression    peripheral neuropathy,  Cancer    negative hematology/oncology ROS,                 Physical Assessment      Airway       Mallampati: I    TM distance: <3 FB    Mouth Opening: good.            Dental           (+) upper dentures           Pulmonary           Cardiovascular             Other findings            Plan  ASA 3     Planned anesthesia type: MAC                     Intravenous induction     Anesthesia issues/risks discussed are: Dental Injuries, Stroke, Sore Throat and Cardiac Events/MI.  Anesthetic plan and risks discussed with patient             Patient's NPO status is appropriate for Anesthesia.

## 2022-06-21 DIAGNOSIS — N870 Mild cervical dysplasia: Secondary | ICD-10-CM

## 2022-06-21 DIAGNOSIS — Z6821 Body mass index (BMI) 21.0-21.9, adult: Secondary | ICD-10-CM

## 2022-06-21 DIAGNOSIS — N900 Mild vulvar dysplasia: Secondary | ICD-10-CM

## 2022-06-21 LAB — SURGICAL PATHOLOGY SPECIMEN: Clinical History: HIGH

## 2022-07-28 ENCOUNTER — Ambulatory Visit (INDEPENDENT_AMBULATORY_CARE_PROVIDER_SITE_OTHER): Payer: Self-pay | Admitting: Neurology

## 2022-09-20 ENCOUNTER — Emergency Department
Admission: EM | Admit: 2022-09-20 | Discharge: 2022-09-20 | Disposition: A | Discharge status: Home / Self Care | Payer: MEDICAID | Attending: Physician Assistant | Admitting: Physician Assistant

## 2022-09-20 ENCOUNTER — Other Ambulatory Visit: Payer: Self-pay

## 2022-09-20 DIAGNOSIS — B9689 Other specified bacterial agents as the cause of diseases classified elsewhere: Secondary | ICD-10-CM | POA: Insufficient documentation

## 2022-09-20 DIAGNOSIS — W1781XA Fall down embankment (hill), initial encounter: Secondary | ICD-10-CM

## 2022-09-20 DIAGNOSIS — L02214 Cutaneous abscess of groin: Secondary | ICD-10-CM | POA: Insufficient documentation

## 2022-09-20 DIAGNOSIS — F17210 Nicotine dependence, cigarettes, uncomplicated: Secondary | ICD-10-CM | POA: Insufficient documentation

## 2022-09-20 DIAGNOSIS — L900 Lichen sclerosus et atrophicus: Secondary | ICD-10-CM | POA: Insufficient documentation

## 2022-09-20 DIAGNOSIS — W010XXA Fall on same level from slipping, tripping and stumbling without subsequent striking against object, initial encounter: Secondary | ICD-10-CM | POA: Insufficient documentation

## 2022-09-20 DIAGNOSIS — N760 Acute vaginitis: Secondary | ICD-10-CM | POA: Insufficient documentation

## 2022-09-20 LAB — URINALYSIS, MACROSCOPIC
BILIRUBIN: NEGATIVE mg/dL
BLOOD: NEGATIVE mg/dL
GLUCOSE: NEGATIVE mg/dL
KETONES: NEGATIVE mg/dL
LEUKOCYTES: 75 WBCs/uL — AB
NITRITE: NEGATIVE
PH: 6.5 (ref 5.0–9.0)
PROTEIN: NEGATIVE mg/dL
SPECIFIC GRAVITY: 1.004 (ref 1.002–1.030)
UROBILINOGEN: NORMAL mg/dL

## 2022-09-20 LAB — CBC WITH DIFF
BASOPHIL #: 0.1 10*3/uL (ref 0.00–0.10)
BASOPHIL %: 1 % (ref 0–1)
EOSINOPHIL #: 0.1 10*3/uL (ref 0.00–0.50)
EOSINOPHIL %: 1 %
HCT: 39.4 % (ref 31.2–41.9)
HGB: 13.4 g/dL (ref 10.9–14.3)
LYMPHOCYTE #: 3.2 10*3/uL — ABNORMAL HIGH (ref 1.00–3.00)
LYMPHOCYTE %: 29 % (ref 16–44)
MCH: 33.5 pg — ABNORMAL HIGH (ref 24.7–32.8)
MCHC: 34 g/dL (ref 32.3–35.6)
MCV: 98.4 fL — ABNORMAL HIGH (ref 75.5–95.3)
MONOCYTE #: 1 10*3/uL (ref 0.30–1.00)
MONOCYTE %: 9 % (ref 5–13)
MPV: 8.3 fL (ref 7.9–10.8)
NEUTROPHIL #: 6.9 10*3/uL (ref 1.85–7.80)
NEUTROPHIL %: 61 % (ref 43–77)
PLATELETS: 299 10*3/uL (ref 140–440)
RBC: 4 10*6/uL (ref 3.63–4.92)
RDW: 13.5 % (ref 12.3–17.7)
WBC: 11.2 10*3/uL (ref 3.8–11.8)

## 2022-09-20 LAB — URINALYSIS, MICROSCOPIC
NON-SQUAMOUS EPITHELIAL CELLS URINE: <1 /hpf (ref <=1)
RBCS: 2 /hpf (ref <4)
SQUAMOUS EPITHELIAL: 23 /hpf (ref <28)
WBCS: 10 /hpf — ABNORMAL HIGH (ref <6)

## 2022-09-20 LAB — BASIC METABOLIC PANEL
ANION GAP: 7 mmol/L (ref 4–13)
BUN/CREA RATIO: 21 (ref 6–22)
BUN: 13 mg/dL (ref 7–25)
CALCIUM: 9.1 mg/dL (ref 8.6–10.3)
CHLORIDE: 106 mmol/L (ref 98–107)
CO2 TOTAL: 24 mmol/L (ref 21–31)
CREATININE: 0.63 mg/dL (ref 0.60–1.30)
ESTIMATED GFR: 111 mL/min/{1.73_m2} (ref >59)
GLUCOSE: 99 mg/dL (ref 74–109)
OSMOLALITY, CALCULATED: 274 mOsm/kg (ref 270–290)
POTASSIUM: 3.8 mmol/L (ref 3.5–5.1)
SODIUM: 137 mmol/L (ref 136–145)

## 2022-09-20 LAB — WETMOUNT
TRICHOMONAS: ABSENT
YEAST: ABSENT

## 2022-09-20 LAB — CHLAMYDIA / NEISSERIA DNA BY PCR
CHLAMYDIA TRACHOMATIS PCR: NOT DETECTED
NEISSERIA GONORRHOEAE PCR: NOT DETECTED

## 2022-09-20 MED ORDER — CLINDAMYCIN HCL 300 MG CAPSULE
300.0000 mg | ORAL_CAPSULE | Freq: Three times a day (TID) | ORAL | 0 refills | Status: AC
Start: 2022-09-20 — End: 2022-09-27

## 2022-09-20 NOTE — ED Nurses Note (Signed)
Patient discharged home. AVS reviewed with patient. A written copy of the AVS and discharge instructions were given to the patient. Questions sufficiently answered as needed. Patient encouraged to follow up with PCP as indicated. In the event of an emergency, patient instructed to call 911 or go to the nearest emergency room. Patient left department via ambulation.

## 2022-09-20 NOTE — ED Provider Notes (Signed)
Emergency Medicine      Name: Jasmine Brown  Age and Gender: 46 y.o. female  Date of Birth: 11/05/1976  MRN: [id number]  PCP: Zadie Rhine, FNP    CC:  Chief Complaint   Patient presents with    Vaginal Pain       HPI:  Jasmine Brown is a 46 y.o. White female who presents to the ER with vaginal pain. Patient states she had a surgery by [facility] on July 25 where she had a cone biopsy of her cervix and a vulva biopsy. She says it took her over a month to recover from the surgery. However, states about 1.5 weeks ago, her significant other "went off" and she had to run away. She says she fell while running and slid down a hill, and since that time, has been having increased pain and swelling in the area. She also reports some lower abdominal discomfort and is concerned she has an infection.    Below pertinent information reviewed with patient:  Past Medical History:   Diagnosis Date    Anemia     Anxiety state     Breast mass     Multiple masses due to have a repeat 6 month Mammo 5/23    Depression     High cholesterol     HPV (human papilloma virus) infection     Migraines     Multiple lesions on CT of brain and spine     Muscle spasm     Neuropathy (CMS HCC)     tendons and muscles are fading away    Seizures (CMS HCC)     Stroke (CMS HCC)          Allergies   Allergen Reactions    Aimovig Autoinjector [Erenumab-Aooe]     Bactrim [Sulfamethoxazole-Trimethoprim]     Codeine     Furosemide     Hctz [Hydrochlorothiazide]     Maxidex [Dexamethasone]     Sulfa (Sulfonamides)     Triamterene     Trimethoprim        Past Surgical History:   Procedure Laterality Date    DENTAL SURGERY      HX DILATION AND CURETTAGE           Social History     Socioeconomic History    Marital status: Divorced     Spouse name: Jasmine Brown    Number of children: 2    Years of education: 12   Occupational History    Occupation: Personal assistant   Tobacco Use    Smoking status: Every Day     Packs/day: 1     Types: Cigarettes    Smokeless tobacco:  Never   Vaping Use    Vaping Use: Every day    Substances: THC, CBD   Substance and Sexual Activity    Alcohol use: Not Currently    Drug use: Never       ROS:  No other overt positive review of systems are noted other than stated in the HPI.      Objective:    ED Triage Vitals [09/20/22 1823]   BP (Non-Invasive) 113/87   Heart Rate 84   Respiratory Rate 17   Temperature 36.6 C (97.8 F)   SpO2 99 %   Weight 43.5 kg (96 lb)   Height 1.499 m ('4\' 11"'$ )     Filed Vitals:    09/20/22 1823 09/20/22 2123   BP: 113/87 119/86   Pulse: 84 79  Resp: 17 18   Temp: 36.6 C (97.8 F) 36.3 C (97.4 F)   SpO2: 99% 98%       Nursing notes and vital signs reviewed.    Constitutional - No acute distress.  Alert and Active.  HEENT - Normocephalic. Conjunctiva clear. Moist mucous membranes.   Neck - Trachea midline. No stridor. No hoarseness.  Cardiac - Regular rate and rhythm. No murmurs, rubs, or gallops.   Respiratory/Chest - Normal respiratory effort. Clear to auscultation bilaterally. No rales, wheezes or rhonchi.   Abdomen - Normal bowel sounds. Mild suprapubic tenderness, soft, non-distended. No rebound or guarding.   GU - No external or internal evidence of injury or trauma. There is a small, tender, mildly erythematous indurated cutaneous nodular lesion lateral to the introitus and labia majora. There are also tender, white thickened macular lesions of the mucosa of the right upper labia and a few smaller areas along the left side of the introitus. There is a moderate amount of creamy white vaginal discharge with an expected appearance of the cervix post conization.  Musculoskeletal - Good AROM.  No clubbing, cyanosis or edema.  Skin - Warm and dry, without any rashes or other lesions.  Neuro - Alert and oriented x 3. Moving all extremities symmetrically.   Psych - Normal mood and affect. Behavior is normal       Any pertinent labs and imaging obtained during this encounter reviewed below in MDM.    MDM/ED  Course:        Medical Decision Making  Patient presented to the ER with complaints of vaginal pain and swelling since a fall while running from her significant other. She had surgery in July of this year where she had a LEEP conization of the cervix and a elliptical biopsy of her vulva Dr Dr Theodosia Quay and was concerned she had reopened a surgical wound. She did report a history of lichen sclerosis for which she had been without her regular prescribed treatment of testosterone cream after leaving it at the home of her ex. Patient primarily complained of vaginal pain, but did report some lower abdominal discomfort as well. On exam, she has a small cutaneous nodule in the groin with the appearance of an early abscess. She has vulvar findings of lichen sclerosis, which is where she localizes the most severe pain. Furthermore, she reports similar vaginal symptoms in the past related to her lichen sclerosis when she has been without treatment. She did verbalize concern for sepsis, despite any systemic symptoms such as fever, body aches, nausea, vomiting, etc and for this reason, labs were ordered which were unremarkable. UA with 10 wbcs, felt to likely be related to her vaginal discharge. Wet prep revealed clue cells. GC gen x-pert pending at time of discharge. Patient's lichen sclerosis and early groin abscess felt to be the source of her symptoms. She will be treated with clindamycin 300 mg TID x 7 days with instructions to apply warm moist compresses to the groin abscess. She will need to get her testosterone cream from her significant other's home or follow up with Dr Luana Shu for a new prescription. She will be discharged home in stable condition at this time.    Amount and/or Complexity of Data Reviewed  Labs: ordered. Decision-making details documented in ED Course.  ECG/medicine tests: independent interpretation performed.    Risk  Prescription drug management.          ED Course as of 09/20/22 2142   Wed Sep 20, 2022   2119 CBC/DIFF(!)  Unremarkable   2124 WBC'S(!): 10   2124 LEUKOCYTES(!): 75   2536 BASIC METABOLIC PANEL  Normal   6440 WETMOUNT(!)  Positive clue cells, negative Trichomonas and yeast       Orders Placed This Encounter    URINE CULTURE,ROUTINE    WETMOUNT    CBC/DIFF    BASIC METABOLIC PANEL    URINALYSIS, MACROSCOPIC AND MICROSCOPIC W/CULTURE REFLEX    CBC WITH DIFF    URINALYSIS, MACROSCOPIC    URINALYSIS, MICROSCOPIC    CHLAMYDIA / NEISSERIA DNA BY PCR    clindamycin (CLEOCIN) 300 mg Oral Capsule         Impression:   Clinical Impression   Cutaneous abscess of groin (Primary)   Lichen sclerosus   Bacterial vaginosis       Disposition: Discharged      Portions of this note may have been dictated using voice recognition software.     Elige Ko PA-C    -----------------------  Results for orders placed or performed during the hospital encounter of 09/20/22 (from the past 12 hour(s))   BASIC METABOLIC PANEL   Result Value Ref Range    SODIUM 137 136 - 145 mmol/L    POTASSIUM 3.8 3.5 - 5.1 mmol/L    CHLORIDE 106 98 - 107 mmol/L    CO2 TOTAL 24 21 - 31 mmol/L    ANION GAP 7 4 - 13 mmol/L    CALCIUM 9.1 8.6 - 10.3 mg/dL    GLUCOSE 99 74 - 109 mg/dL    BUN 13 7 - 25 mg/dL    CREATININE 0.63 0.60 - 1.30 mg/dL    BUN/CREA RATIO 21 6 - 22    ESTIMATED GFR 111 >59 mL/min/1.23m2    OSMOLALITY, CALCULATED 274 270 - 290 mOsm/kg   CBC WITH DIFF   Result Value Ref Range    WBC 11.2 3.8 - 11.8 x10^3/uL    RBC 4.00 3.63 - 4.92 x10^6/uL    HGB 13.4 10.9 - 14.3 g/dL    HCT 39.4 31.2 - 41.9 %    MCV 98.4 (H) 75.5 - 95.3 fL    MCH 33.5 (H) 24.7 - 32.8 pg    MCHC 34.0 32.3 - 35.6 g/dL    RDW 13.5 12.3 - 17.7 %    PLATELETS 299 140 - 440 x10^3/uL    MPV 8.3 7.9 - 10.8 fL    NEUTROPHIL % 61 43 - 77 %    LYMPHOCYTE % 29 16 - 44 %    MONOCYTE % 9 5 - 13 %    EOSINOPHIL % 1 %    BASOPHIL % 1 0 - 1 %    NEUTROPHIL # 6.90 1.85 - 7.80 x10^3/uL    LYMPHOCYTE # 3.20 (H) 1.00 - 3.00 x10^3/uL    MONOCYTE # 1.00 0.30 - 1.00 x10^3/uL     EOSINOPHIL # 0.10 0.00 - 0.50 x10^3/uL    BASOPHIL # 0.10 0.00 - 0.10 x10^3/uL   URINALYSIS, MACROSCOPIC   Result Value Ref Range    COLOR Yellow Colorless, Light Yellow, Yellow    APPEARANCE Turbid (A) Clear    SPECIFIC GRAVITY 1.004 1.002 - 1.030    PH 6.5 5.0 - 9.0    LEUKOCYTES 75 (A) Negative, 100  WBCs/uL    NITRITE Negative Negative    PROTEIN Negative Negative, 10 , 20  mg/dL    GLUCOSE Negative Negative, 30  mg/dL    KETONES Negative Negative, Trace  mg/dL    BILIRUBIN Negative Negative, 0.5 mg/dL    BLOOD Negative Negative, 0.03 mg/dL    UROBILINOGEN Normal Normal mg/dL   URINALYSIS, MICROSCOPIC   Result Value Ref Range    BACTERIA Rare (A) Negative /hpf    MUCOUS Rare (A) (none) /hpf    BUDDING YEAST Few (A) (none) /hpf    RBCS 2 <4 /hpf    WBCS 10 (H) <6 /hpf    SQUAMOUS EPITHELIAL 23 <28 /hpf    NON-SQUAMOUS EPITHELIAL CELLS URINE <1 <=1 /hpf   WETMOUNT    Specimen: Vaginal; Other   Result Value Ref Range    TRICHOMONAS Absent Absent    YEAST Absent Absent    CLUE CELLS Present (A) Absent     No orders to display

## 2022-09-20 NOTE — ED Triage Notes (Signed)
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 States since then she has seen another doctor who said she did not have cancer. States she was running couple days ago and fell over the hill and has pain in vagina and feels like a knot is down there.

## 2022-09-20 NOTE — Discharge Instructions (Signed)
Follow-up with Dr. Luana Shu for further management of your lichen sclerosis.  Apply warm moist compresses to swollen area in your groin.  Take clindamycin until gone.  Return to the ER for other emergencies or as needed

## 2022-09-22 LAB — URINE CULTURE,ROUTINE: URINE CULTURE: >100000 — AB

## 2023-01-18 ENCOUNTER — Other Ambulatory Visit (HOSPITAL_COMMUNITY): Payer: Self-pay | Admitting: NURSE PRACTITIONER

## 2023-01-18 DIAGNOSIS — Z1239 Encounter for other screening for malignant neoplasm of breast: Secondary | ICD-10-CM

## 2023-03-20 ENCOUNTER — Other Ambulatory Visit: Payer: MEDICAID | Attending: PHYSICIAN ASSISTANT | Admitting: PHYSICIAN ASSISTANT

## 2023-03-20 DIAGNOSIS — N870 Mild cervical dysplasia: Secondary | ICD-10-CM | POA: Insufficient documentation

## 2023-03-20 DIAGNOSIS — R87610 Atypical squamous cells of undetermined significance on cytologic smear of cervix (ASC-US): Secondary | ICD-10-CM

## 2023-03-22 ENCOUNTER — Other Ambulatory Visit: Payer: Self-pay

## 2023-03-23 DIAGNOSIS — N870 Mild cervical dysplasia: Secondary | ICD-10-CM

## 2023-03-23 LAB — SURGICAL PATHOLOGY SPECIMEN: Clinical History: POSITIVE

## 2023-05-01 ENCOUNTER — Ambulatory Visit (HOSPITAL_COMMUNITY): Payer: Self-pay

## 2023-05-03 ENCOUNTER — Encounter (HOSPITAL_COMMUNITY): Payer: Self-pay

## 2023-05-03 ENCOUNTER — Other Ambulatory Visit: Payer: Self-pay

## 2023-05-03 ENCOUNTER — Inpatient Hospital Stay
Admission: RE | Admit: 2023-05-03 | Discharge: 2023-05-03 | Disposition: A | Discharge status: Home / Self Care | Payer: Medicare Other | Source: Ambulatory Visit | Attending: NURSE PRACTITIONER | Admitting: NURSE PRACTITIONER

## 2023-05-03 DIAGNOSIS — Z1231 Encounter for screening mammogram for malignant neoplasm of breast: Secondary | ICD-10-CM | POA: Insufficient documentation

## 2023-05-03 DIAGNOSIS — Z1239 Encounter for other screening for malignant neoplasm of breast: Secondary | ICD-10-CM

## 2023-05-03 HISTORY — DX: Malignant neoplasm of cervix uteri, unspecified: C53.9

## 2023-09-12 ENCOUNTER — Other Ambulatory Visit (HOSPITAL_COMMUNITY): Payer: Self-pay | Admitting: NURSE PRACTITIONER

## 2023-09-12 DIAGNOSIS — R7401 Elevation of levels of liver transaminase levels: Secondary | ICD-10-CM

## 2023-09-20 ENCOUNTER — Ambulatory Visit (HOSPITAL_COMMUNITY): Payer: Self-pay

## 2023-10-10 LAB — LAB: COLOGUARD RESULT: NEGATIVE

## 2023-10-10 LAB — COLOGUARD® COLON CANCER SCREEN: COLOGUARD RESULT: NEGATIVE

## 2023-10-11 ENCOUNTER — Encounter (HOSPITAL_COMMUNITY): Payer: Self-pay

## 2023-10-11 ENCOUNTER — Other Ambulatory Visit (INDEPENDENT_AMBULATORY_CARE_PROVIDER_SITE_OTHER): Payer: Self-pay

## 2023-11-06 ENCOUNTER — Other Ambulatory Visit: Payer: Self-pay

## 2023-11-06 ENCOUNTER — Emergency Department
Admission: EM | Admit: 2023-11-06 | Discharge: 2023-11-06 | Disposition: A | Discharge status: Home / Self Care | Payer: Medicare Other | Attending: Family | Admitting: Family

## 2023-11-06 ENCOUNTER — Encounter (HOSPITAL_BASED_OUTPATIENT_CLINIC_OR_DEPARTMENT_OTHER): Payer: Self-pay

## 2023-11-06 ENCOUNTER — Emergency Department (HOSPITAL_BASED_OUTPATIENT_CLINIC_OR_DEPARTMENT_OTHER): Payer: Medicare Other

## 2023-11-06 DIAGNOSIS — M7989 Other specified soft tissue disorders: Secondary | ICD-10-CM | POA: Insufficient documentation

## 2023-11-06 DIAGNOSIS — Z560 Unemployment, unspecified: Secondary | ICD-10-CM | POA: Insufficient documentation

## 2023-11-06 DIAGNOSIS — X58XXXA Exposure to other specified factors, initial encounter: Secondary | ICD-10-CM | POA: Insufficient documentation

## 2023-11-06 DIAGNOSIS — S60212A Contusion of left wrist, initial encounter: Secondary | ICD-10-CM | POA: Insufficient documentation

## 2023-11-06 LAB — COMPREHENSIVE METABOLIC PANEL, NON-FASTING
ALBUMIN/GLOBULIN RATIO: 0.9 (ref 0.8–1.4)
ALBUMIN: 3.3 g/dL — ABNORMAL LOW (ref 3.4–5.0)
ALKALINE PHOSPHATASE: 79 U/L (ref 46–116)
ALT (SGPT): 20 U/L (ref <=78)
ANION GAP: 12 mmol/L (ref 4–13)
AST (SGOT): 19 U/L (ref 15–37)
BILIRUBIN TOTAL: 0.3 mg/dL (ref 0.2–1.0)
BUN/CREA RATIO: 8
BUN: 6 mg/dL — ABNORMAL LOW (ref 7–18)
CALCIUM, CORRECTED: 9.3 mg/dL
CALCIUM: 8.7 mg/dL (ref 8.5–10.1)
CHLORIDE: 102 mmol/L (ref 98–107)
CO2 TOTAL: 27 mmol/L (ref 21–32)
CREATININE: 0.77 mg/dL (ref 0.55–1.02)
ESTIMATED GFR: 96 mL/min/{1.73_m2} (ref >59)
GLOBULIN: 3.6
GLUCOSE: 96 mg/dL (ref 74–106)
OSMOLALITY, CALCULATED: 279 mosm/kg (ref 270–290)
POTASSIUM: 4.2 mmol/L (ref 3.5–5.1)
PROTEIN TOTAL: 6.9 g/dL (ref 6.4–8.2)
SODIUM: 141 mmol/L (ref 136–145)

## 2023-11-06 LAB — URIC ACID: URIC ACID: 3 mg/dL (ref 2.6–6.0)

## 2023-11-06 LAB — CBC WITH DIFF
BASOPHIL #: 0.05 10*3/uL (ref 0.00–0.30)
BASOPHIL %: 0 % (ref 0–3)
EOSINOPHIL #: 0.56 10*3/uL (ref 0.00–0.80)
EOSINOPHIL %: 6 % (ref 1–7)
HCT: 45.8 % (ref 37.0–47.0)
HGB: 15.6 g/dL (ref 12.5–16.0)
LYMPHOCYTE #: 3.94 10*3/uL (ref 1.10–5.00)
LYMPHOCYTE %: 40 % (ref 25–45)
MCH: 35.5 pg — ABNORMAL HIGH (ref 27.0–32.0)
MCHC: 34 g/dL (ref 32.0–36.0)
MCV: 104.3 fL — ABNORMAL HIGH (ref 78.0–99.0)
MONOCYTE #: 1.1 10*3/uL (ref 0.00–1.30)
MONOCYTE %: 11 % (ref 0–12)
MPV: 7.5 fL (ref 7.4–10.4)
NEUTROPHIL #: 4.3 10*3/uL (ref 1.80–8.40)
NEUTROPHIL %: 43 % (ref 40–76)
PLATELETS: 270 10*3/uL (ref 140–440)
RBC: 4.39 10*6/uL (ref 4.20–5.40)
RDW: 15.2 % — ABNORMAL HIGH (ref 11.6–14.8)
WBC: 10 10*3/uL (ref 4.0–10.5)

## 2023-11-06 LAB — CREATINE KINASE (CK), TOTAL, SERUM OR PLASMA: CREATINE KINASE: 44 U/L (ref 26–192)

## 2023-11-06 LAB — LACTIC ACID LEVEL W/ REFLEX FOR LEVEL >2.0: LACTIC ACID: 0.9 mmol/L (ref 0.4–2.0)

## 2023-11-06 MED ORDER — KETOROLAC 60 MG/2 ML INTRAMUSCULAR SOLUTION
INTRAMUSCULAR | Status: AC
Start: 2023-11-06 — End: 2023-11-06
  Filled 2023-11-06: qty 2

## 2023-11-06 MED ORDER — DICLOFENAC SODIUM 25 MG TABLET,DELAYED RELEASE
25.0000 mg | DELAYED_RELEASE_TABLET | Freq: Two times a day (BID) | ORAL | 0 refills | Status: AC
Start: 2023-11-06 — End: 2023-11-16

## 2023-11-06 MED ORDER — KETOROLAC 60 MG/2 ML INTRAMUSCULAR SOLUTION
60.0000 mg | INTRAMUSCULAR | Status: AC
Start: 2023-11-06 — End: 2023-11-06
  Administered 2023-11-06: 60 mg via INTRAMUSCULAR

## 2023-11-06 NOTE — ED Nurses Note (Signed)

## 2023-11-06 NOTE — ED Nurses Note (Signed)
Left wrist splint applied. Patient states "oh that makes my arm feel better".

## 2023-11-06 NOTE — ED Triage Notes (Signed)
Patient states she hurt her left arm but does not know how.  Left wrist noted to be swollen.  States started @ her wrist and goes up about halfway to elbow.

## 2023-11-06 NOTE — ED Provider Notes (Signed)
[facility], [facility] - Emergency Department  ED Primary Provider Note  History of Present Illness   Chief Complaint   Patient presents with    Arm Injury     Arrival: The patient arrived by Car  Jasmine Brown is a 48 y.o. female who had concerns including Arm Injury.  Pt states left wrist pain and swelling states unsure how she hurt it states she has involuntary tremors may have hit it in her sleep. Denies clots, gout, has had cellulitis once   Review of Systems   Constitutional: No fever, chills or weakness   Skin: No rash or diaphoresis+ swelling.   HENT: No headaches, or congestion  Eyes: No vision changes or photophobia   Cardio: No chest pain, palpitations or leg swelling   Respiratory: No cough, wheezing or SOB  GI:  No nausea, vomiting or stool changes  GU:  No dysuria, hematuria, or increased frequency  MSK: No muscle aches, +joint  pain  Neuro: No seizures, LOC, numbness, tingling, or focal weakness  Psychiatric: No depression, SI or substance abuse  All other systems reviewed and are negative.    History Reviewed This Encounter: all noted and reviewed.     Physical Exam   ED Triage Vitals [11/06/23 0938]   BP (Non-Invasive) 96/60   Heart Rate 77   Respiratory Rate 17   Temperature 36.2 C (97.1 F)   SpO2 97 %   Weight 50.3 kg (111 lb)   Height 1.499 m (4\' 11" )       Constitutional:  47 y.o. female who appears in no distress. Normal color, no cyanosis.   HENT:   Head: Normocephalic and atraumatic.   Mouth/Throat: Oropharynx is clear and moist.   Eyes: EOMI, PERRL   Neck: Trachea midline. Neck supple.  Cardiovascular: RRR, No murmurs, rubs or gallops. Intact distal pulses.  Pulmonary/Chest: BS equal bilaterally. No respiratory distress. No wheezes, rales or chest tenderness.   Abdominal: Bowel sounds present and normal. Abdomen soft, no tenderness, no rebound and no guarding.  Back: No midline spinal tenderness, no paraspinal tenderness, no CVA tenderness.           Musculoskeletal: No  edema, tenderness or deformity.  Skin: warm and dry. No rash, erythema, pallor or cyanosis+ bruising left ulnar aspect.   Psychiatric: normal mood and affect. Behavior is normal.   Neurological: Patient keenly alert and responsive, easily able to raise eyebrows, facial muscles/expressions symmetric, speaking in fluent sentences, moving all extremities equally and fully, normal gait  Patient Data     Labs Ordered/Reviewed   COMPREHENSIVE METABOLIC PANEL, NON-FASTING - Abnormal; Notable for the following components:       Result Value    BUN 6 (*)     ALBUMIN 3.3 (*)     All other components within normal limits    Narrative:     Estimated Glomerular Filtration Rate (eGFR) is calculated using the CKD-EPI (2021) equation, intended for patients 5 years of age and older. If gender is not documented or "unknown", there will be no eGFR calculation.   CBC WITH DIFF - Abnormal; Notable for the following components:    MCV 104.3 (*)     MCH 35.5 (*)     RDW 15.2 (*)     All other components within normal limits   URIC ACID - Normal   LACTIC ACID LEVEL W/ REFLEX FOR LEVEL >2.0 - Normal   CREATINE KINASE (CK), TOTAL, SERUM OR PLASMA - Normal   CBC/DIFF  Narrative:     The following orders were created for panel order CBC/DIFF.  Procedure                               Abnormality         Status                     ---------                               -----------         ------                     CBC WITH YQMV[[id number]]                Abnormal            Final result                 Please view results for these tests on the individual orders.     XR WRIST LEFT   Final Result by Edi, Radresults In (12/10 1034)   SOFT TISSUE SWELLING. NO  FRACTURE IDENTIFIED.                     Radiologist location ID: [id number]           Medical Decision Making   Diff dx wrist contusion fx, gout cellulitis  xray negative for fx. Velcro cock up brace (premade) applied cap refill 2 sec .  ED Course as of 11/06/23 1057   Tue Nov 06, 2023    1055 LACTIC ACID: 0.9   1055 WBC: 10.0   1055 CREATINE KINASE (CK): 44   1055 URIC ACID: 3.0            Medications Administered in the ED   ketorolac (TORADOL) 60mg /2 mL IM injection (60 mg IntraMUSCULAR Given 11/06/23 1014)     Clinical Impression   Contusion of left wrist, initial encounter (Primary)   Swelling of left upper extremity       Disposition: Discharged

## 2023-11-07 NOTE — ED Notes (Signed)
[facility], [facility] - Emergency Department  Peer Recovery Coach Assessment    Initial Evaluation  Referred by:: Nurse  Location of Evaluation: Emergency Department  How many times in the last 12 months have you been to the ED?: 1  Have you ever served or are you currently serving in the Armed Forces?: No             Substance Use History  Patient current substance use status: Patient stated she smokes marijuana recreationally.    Prior treatment history?: No    Currently enrolled in substance use program?: No    Within the last 30 days, what substances has the patient used?: Marijuana  Patient's age at first substance use?: [age]  Drug route of administration: Smoked    Has the patient ever had sustained abstinence?: No              Family, Social, Home & Safety History  Marital Status: Divorced            Need to improve relationships with family?: No    Social network: Immediate family, Substance using peers, Non-substance using peers/friends/other    Current living situation: Independent  Any help needed with the following?: None  Contact phone number for the patient: [phone]       Has the patient had any legal issues within the past 30 days?: None         Employment  Current employment status: Unemployed    Diplomatic Services operational officer?: No  Needs assistance with job search?: No    Engagement  Readiness ruler: 1  Summary of assessment priority areas: Other    Brief Intervention  Discussed plan to reduce/quit substance use?: Yes  Discussed willingness to enter treatment?: Yes  Indicated patient's stage of change:: 1 - Precontemplation    Patient seen by Peer Recovery Coach and is a candidate for buprenorphine administration in the ED. Patient needs assessment for bup treatment.: No (non opioid user)    Plan  Was the patient referred to treatment?: No    Was patient referred to physician for Buprenorphine Assessment in the ED?: No    Did patient receive Narcan in the ED?: No    Plan:  Additional Comments: Safe use qand long-term affects were discussed.    Follow-up  Patient admitted for treatment?: No        Need for additional follow-up?: No       Haynes Bast, Peer Recovery Coach 11/07/2023 08:50

## 2023-12-16 ENCOUNTER — Emergency Department
Admission: EM | Admit: 2023-12-16 | Discharge: 2023-12-17 | Disposition: A | Discharge status: Home / Self Care | Payer: Medicare (Managed Care) | Attending: Emergency Medicine | Admitting: Emergency Medicine

## 2023-12-16 ENCOUNTER — Other Ambulatory Visit: Payer: Self-pay

## 2023-12-16 DIAGNOSIS — Z79899 Other long term (current) drug therapy: Secondary | ICD-10-CM | POA: Insufficient documentation

## 2023-12-16 DIAGNOSIS — M62838 Other muscle spasm: Secondary | ICD-10-CM | POA: Insufficient documentation

## 2023-12-16 DIAGNOSIS — F411 Generalized anxiety disorder: Secondary | ICD-10-CM | POA: Insufficient documentation

## 2023-12-16 DIAGNOSIS — D72829 Elevated white blood cell count, unspecified: Secondary | ICD-10-CM | POA: Insufficient documentation

## 2023-12-16 NOTE — ED Triage Notes (Signed)
Muscle spasms, pressure in head, fatigue, parts of body "locking up." X2 weeks, worsening.

## 2023-12-16 NOTE — ED Nurses Note (Addendum)
Pt to ED 20 via triage. PT states that "I just can't do this anymore, going from one provider to another and one treatment to the next." Pt denies any SI/HI at this time. Pt states she is here due to her body not wanting to work and "locking up on me," pt states that she was on steroids prior to switching dr's and states it was working but states that her provider moved and that her baclofen is no longer helping her and that she needs something else. Pt placed on bedside monitor (BP and pulse ox) at this time. Pt given call light and encouraged to call out with any further needs.

## 2023-12-17 ENCOUNTER — Telehealth (HOSPITAL_COMMUNITY): Payer: Self-pay

## 2023-12-17 ENCOUNTER — Encounter (HOSPITAL_COMMUNITY): Payer: Self-pay | Admitting: Emergency Medicine

## 2023-12-17 LAB — BASIC METABOLIC PANEL
ANION GAP: 8 mmol/L (ref 4–13)
BUN/CREA RATIO: 10 (ref 6–22)
BUN: 6 mg/dL — ABNORMAL LOW (ref 7–25)
CALCIUM: 8.6 mg/dL (ref 8.6–10.3)
CHLORIDE: 106 mmol/L (ref 98–107)
CO2 TOTAL: 22 mmol/L (ref 21–31)
CREATININE: 0.62 mg/dL (ref 0.60–1.30)
ESTIMATED GFR: 110 mL/min/{1.73_m2} (ref >59)
GLUCOSE: 87 mg/dL (ref 74–109)
OSMOLALITY, CALCULATED: 269 mosm/kg — ABNORMAL LOW (ref 270–290)
POTASSIUM: 3.8 mmol/L (ref 3.5–5.1)
SODIUM: 136 mmol/L (ref 136–145)

## 2023-12-17 LAB — CBC WITH DIFF
BASOPHIL #: 0.1 10*3/uL (ref 0.00–0.10)
BASOPHIL %: 0 % (ref 0–1)
EOSINOPHIL #: 0.2 10*3/uL (ref 0.00–0.50)
EOSINOPHIL %: 2 % (ref 1–7)
HCT: 42.9 % — ABNORMAL HIGH (ref 31.2–41.9)
HGB: 15 g/dL — ABNORMAL HIGH (ref 10.9–14.3)
LYMPHOCYTE #: 3.7 10*3/uL — ABNORMAL HIGH (ref 1.00–3.00)
LYMPHOCYTE %: 26 % (ref 16–44)
MCH: 35.3 pg — ABNORMAL HIGH (ref 24.7–32.8)
MCHC: 35.1 g/dL (ref 32.3–35.6)
MCV: 100.7 fL — ABNORMAL HIGH (ref 75.5–95.3)
MONOCYTE #: 1.3 10*3/uL — ABNORMAL HIGH (ref 0.30–1.00)
MONOCYTE %: 9 % (ref 5–13)
MPV: 8.1 fL (ref 7.9–10.8)
NEUTROPHIL #: 9 10*3/uL — ABNORMAL HIGH (ref 1.85–7.80)
NEUTROPHIL %: 63 % (ref 43–77)
PLATELETS: 227 10*3/uL (ref 140–440)
RBC: 4.25 10*6/uL (ref 3.63–4.92)
RDW: 13.7 % (ref 12.3–17.7)
WBC: 14.3 10*3/uL — ABNORMAL HIGH (ref 3.8–11.8)

## 2023-12-17 LAB — URINALYSIS, MACROSCOPIC
BILIRUBIN: NEGATIVE mg/dL
BLOOD: NEGATIVE mg/dL
GLUCOSE: NEGATIVE mg/dL
KETONES: NEGATIVE mg/dL
LEUKOCYTES: NEGATIVE WBCs/uL
NITRITE: NEGATIVE
PH: 6 (ref 5.0–9.0)
PROTEIN: NEGATIVE mg/dL
SPECIFIC GRAVITY: 1.004 (ref 1.002–1.030)
UROBILINOGEN: NORMAL mg/dL

## 2023-12-17 LAB — URINALYSIS, MICROSCOPIC
RBCS: <1 /[HPF] (ref <4)
SQUAMOUS EPITHELIAL: 1 /[HPF] (ref <28)
WBCS: <1 /[HPF] (ref <6)

## 2023-12-17 LAB — GRAY TOP TUBE

## 2023-12-17 LAB — BLUE TOP TUBE

## 2023-12-17 MED ORDER — DIAZEPAM 5 MG/ML INJECTION SYRINGE
3.0000 mg | INJECTION | INTRAMUSCULAR | Status: AC
Start: 2023-12-17 — End: 2023-12-17
  Administered 2023-12-17: 3 mg via INTRAMUSCULAR

## 2023-12-17 MED ORDER — DIAZEPAM 5 MG/ML INJECTION SYRINGE
INJECTION | INTRAMUSCULAR | Status: AC
Start: 2023-12-17 — End: 2023-12-17
  Filled 2023-12-17: qty 2

## 2023-12-17 NOTE — ED Provider Notes (Signed)
[facility]  DEPARTMENT OF EMERGENCY MEDICINE  EMERGENCY DEPARTMENT HISTORY AND PHYSICAL      Chief Complaint:    Patient is a 48 y.o.  female presenting to the ED with chief complaint of generalized muscle spasm     History of Present Illness:  48 year old female with chronic muscle spasm, anxiety, psychogenic nonepileptic seizure CVA migraine headaches depression, cervical cancer, multiple MRIs with some enhancing lesion presented emergency room with chief complaint of persistent episode of one of muscle spasm spells.  Patient has known longstanding history of involuntary muscle contraction and spasms for which he takes baclofen 10 mg 3 times a day the other medical modalities.  This has been going on for the past 17 years, patient has been worked up and down for the same complaint, has been seen by several specialties including multiple neurologists evaluation.  Patient has been given diagnosis of psychogenic nonepileptic seizures and non specific muscle spasms.  Although patient has lesions in the brain by MRI, did not meet criteria for diagnosis of multiple sclerosis.  The muscle spasm is episodic, patient states that she is compliant with her baclofen but yet the spasm this night is not going away.  On exam she is otherwise alert and oriented, without any motor or sensory deficit or any sign of CVA.  Although patient is signed in as knee pain, upon questioning she says that is  not her knee that she has spasm all over.  In addition to baclofen, patient is also takes Ativan daily for her anxiety.  Patient is also an avid smoker of marijuana and cigarette.  Denies any fall or injury    Past Medical History:  Past Medical History:   Diagnosis Date    Anemia     Anxiety state     Breast mass     Multiple masses due to have a repeat 6 month Mammo 5/23    Cervical cancer (CMS HCC)     Depression     High cholesterol     HPV (human papilloma virus) infection     Migraines     Multiple lesions on CT of brain and spine      Muscle spasm     Neuropathy (CMS HCC)     tendons and muscles are fading away    Seizures (CMS HCC)     Stroke (CMS [facility])      Past Surgical History:   Procedure Laterality Date    Dental surgery      Hx dilation and curettage       Above history reviewed with patient.  Allergies, medication list, and old records also reviewed.   Patient Active Problem List   Diagnosis    Post herpetic neuralgia    CVA (cerebral vascular accident) (CMS HCC)    Migraines    Hydrocephalus (CMS HCC)    Pseudoseizures    Benzodiazepine dependence (CMS HCC)    Seizure-like activity (CMS HCC)    Tobacco use disorder    Slurred speech    Cognitive decline       Social History:  Social History     Tobacco Use    Smoking status: Every Day     Current packs/day: 1.00     Types: Cigarettes    Smokeless tobacco: Never   Vaping Use    Vaping status: Every Day    Substances: THC   Substance Use Topics    Alcohol use: Not Currently    Drug use: Never  Review of Systems:  Constitutional: No fever, or chills  Skin: No rashes or lesions  Resp: Denies cough, or wheezes  Card: Denies chest pain or palpitations  GI: Denies nausea, vomiting, or diarrhea  MSK: Denies any pain or injury  Neuro: Denies any focal motor or sensory deficit  All other systems were reviewed and were negative except for what is mentioned in the HPI.    Physical Exam:   Filed Vitals:    12/16/23 2309   BP: 125/87   Pulse: 79   Resp: 20   Temp: 37 C (98.6 F)   SpO2: 100%     Nursing note and vitals reviewed.  Vital signs reviewed as above.  Mild to moderate  acute distress with muscle spasm which is chronic  Constitutional: Pt is well-developed and well-nourished.   Head: Normocephalic and atraumatic.   Eyes: Conjunctivae are normal. Pupils are equal, round, and reactive to light.   Mouth: Moist, without erythema  Heart: Normal rate  Pulmonary/Chest: Non-labored respirations, clear to auscultation  Abdomen: Soft, active bowel sounds  Musculoskeletal:  No obvious  deformities.  No deformity acute changes, no traumatic injury.  Neurological: Alert and oriented, no obvious focal deficits.  Positive generalized muscle spasm pressure is chronic.  Skin: No rash  Psychiatric: Patient has a normal mood and affect.       Labs:  Results for orders placed or performed during the hospital encounter of 12/16/23   BASIC METABOLIC PANEL   Result Value Ref Range    SODIUM 136 136 - 145 mmol/L    POTASSIUM 3.8 3.5 - 5.1 mmol/L    CHLORIDE 106 98 - 107 mmol/L    CO2 TOTAL 22 21 - 31 mmol/L    ANION GAP 8 4 - 13 mmol/L    CALCIUM 8.6 8.6 - 10.3 mg/dL    GLUCOSE 87 74 - 010 mg/dL    BUN 6 (L) 7 - 25 mg/dL    CREATININE 2.72 5.36 - 1.30 mg/dL    BUN/CREA RATIO 10 6 - 22    ESTIMATED GFR 110 >59 mL/min/1.9m^2    OSMOLALITY, CALCULATED 269 (L) 270 - 290 mOsm/kg   CBC WITH DIFF   Result Value Ref Range    WBC 14.3 (H) 3.8 - 11.8 x10^3/uL    RBC 4.25 3.63 - 4.92 x10^6/uL    HGB 15.0 (H) 10.9 - 14.3 g/dL    HCT 64.4 (H) 03.4 - 41.9 %    MCV 100.7 (H) 75.5 - 95.3 fL    MCH 35.3 (H) 24.7 - 32.8 pg    MCHC 35.1 32.3 - 35.6 g/dL    RDW 74.2 59.5 - 63.8 %    PLATELETS 227 140 - 440 x10^3/uL    MPV 8.1 7.9 - 10.8 fL    NEUTROPHIL % 63 43 - 77 %    LYMPHOCYTE % 26 16 - 44 %    MONOCYTE % 9 5 - 13 %    EOSINOPHIL % 2 1 - 7 %    BASOPHIL % 0 0 - 1 %    NEUTROPHIL # 9.00 (H) 1.85 - 7.80 x10^3/uL    LYMPHOCYTE # 3.70 (H) 1.00 - 3.00 x10^3/uL    MONOCYTE # 1.30 (H) 0.30 - 1.00 x10^3/uL    EOSINOPHIL # 0.20 0.00 - 0.50 x10^3/uL    BASOPHIL # 0.10 0.00 - 0.10 x10^3/uL   URINALYSIS, MACROSCOPIC   Result Value Ref Range    COLOR Yellow Colorless, Light Yellow, Yellow  APPEARANCE Clear Clear    SPECIFIC GRAVITY 1.004 1.002 - 1.030    PH 6.0 5.0 - 9.0    LEUKOCYTES Negative Negative, 100  WBCs/uL    NITRITE Negative Negative    PROTEIN Negative Negative, 10 , 20  mg/dL    GLUCOSE Negative Negative, 30  mg/dL    KETONES Negative Negative, Trace mg/dL    BILIRUBIN Negative Negative, 0.5 mg/dL    BLOOD Negative  Negative, 0.03 mg/dL    UROBILINOGEN Normal Normal mg/dL   URINALYSIS, MICROSCOPIC   Result Value Ref Range    BACTERIA Occasional (A) Negative /hpf    RBCS <1 <4 /hpf    WBCS <1 <6 /hpf    SQUAMOUS EPITHELIAL 1 <28 /hpf       Imaging:       Orders Placed This Encounter    CBC/DIFF    BASIC METABOLIC PANEL    CBC WITH DIFF    EXTRA TUBES    BLUE TOP TUBE    GRAY TOP TUBE    URINALYSIS, MACROSCOPIC AND MICROSCOPIC W/CULTURE REFLEX [PRN ONLY]    URINALYSIS, MACROSCOPIC    URINALYSIS, MICROSCOPIC    diazePAM (VALIUM) 5 mg/mL injection         MDM:  ED Course as of 12/17/23 0233   Mon Dec 17, 2023   0155 Patient does not have any sign or symptom of systemic infection, no fever, but has elevated white count on routine blood work 14.3 I will obtain urinalysis for further evaluation.  Patient responded to the IM Valium is resting more comfortably with less spasm.   6962 Patient's urinalysis is unremarkable.  Based again on the presenting symptoms and patient's medical condition, I do believe that the elevated white count of 14.3 is somewhat of acute phase reactant and stress related secondary to muscle spasm, and not a source of infection.Marland Kitchen   0225 Patient responded to the treatment with with 3 mg of Valium IM.  Patient is already on Ativan for anxiety and baclofen for her chronic muscle spasm.  That is no electrolyte derangement the potassium is adequate.  Patient will be discharged and advised to continuity all her current treatment plans and follow up with her neurologist for follow up as scheduled.   9528 Patient's muscle spasm is a longstanding chronic medical problem that has been evaluated several times by different providers and different neurologist.  Currently under the care of a neurologist that she has upcoming appointment with.         During the patient's stay in the emergency department, the above listed imaging and/or labs were performed to assist with medical decision making and were reviewed by myself as  available for review.   Patient rechecked and remained stable throughout the emergency department course.     Results discussed with patient.   All questions/concerns addressed, and patient agrees with disposition plan.   Advised that patient return to ED immediately for any new or worsening symptoms; otherwise, follow up with PCP.    IMPRESSION:   Clinical Impression   GAD (generalized anxiety disorder) (Primary)   Muscle spasm - Generalized and chronic           DISPOSITION/PLAN:    Discharged  No follow-up provider specified.

## 2023-12-17 NOTE — ED Nurses Note (Signed)
Pt given verbal and written discharge instructions, verbalized understanding

## 2023-12-17 NOTE — Discharge Instructions (Signed)
UR appear to have responded to the Valium as administered here in emergency room.  Will discharge you home continuity your baclofen and Ativan as prescribed.  Please keep your appointment with your primary care physician and neurologist as scheduled for re-evaluation and continuity of care.

## 2023-12-17 NOTE — ED Notes (Signed)
First attempt to follow up, no answer/left msg.

## 2023-12-21 ENCOUNTER — Telehealth (HOSPITAL_COMMUNITY): Payer: Self-pay

## 2023-12-21 NOTE — ED Notes (Signed)
Second attempt to follow up with patient.

## 2023-12-27 ENCOUNTER — Telehealth (HOSPITAL_COMMUNITY): Payer: Self-pay

## 2023-12-27 NOTE — ED Notes (Signed)
Third attempt to follow up with patient.

## 2024-03-14 ENCOUNTER — Encounter (INDEPENDENT_AMBULATORY_CARE_PROVIDER_SITE_OTHER): Payer: Self-pay | Admitting: NEUROLOGY

## 2024-03-14 ENCOUNTER — Other Ambulatory Visit: Payer: Self-pay

## 2024-03-14 ENCOUNTER — Ambulatory Visit (INDEPENDENT_AMBULATORY_CARE_PROVIDER_SITE_OTHER): Payer: Medicare Other | Admitting: NEUROLOGY

## 2024-03-14 VITALS — BP 112/70 | HR 81 | Temp 98.2°F | Ht 59.0 in | Wt 106.0 lb

## 2024-03-14 DIAGNOSIS — G259 Extrapyramidal and movement disorder, unspecified: Secondary | ICD-10-CM

## 2024-03-14 DIAGNOSIS — R930 Abnormal findings on diagnostic imaging of skull and head, not elsewhere classified: Secondary | ICD-10-CM

## 2024-03-14 DIAGNOSIS — E559 Vitamin D deficiency, unspecified: Secondary | ICD-10-CM

## 2024-03-14 DIAGNOSIS — F445 Conversion disorder with seizures or convulsions: Secondary | ICD-10-CM

## 2024-03-14 MED ORDER — DIVALPROEX 250 MG TABLET,DELAYED RELEASE
250.0000 mg | DELAYED_RELEASE_TABLET | Freq: Two times a day (BID) | ORAL | 2 refills | Status: DC
Start: 2024-03-14 — End: 2024-09-24

## 2024-03-14 NOTE — Progress Notes (Unsigned)
 NEUROLOGY, [facility]  [address]  [place] [address]      ASSESSMENT/PLAN  No diagnosis found.: ***    Thank you for allowing me to participate in your patient's care and please do not hesitate to contact me for any questions or concerns.    {Time (Optional):52090}    Leanora Prophet, MD  Assistant Professor of Neurology  [place]  [facility]    =====================================================================    NAME:  Jasmine Brown  DOB:  [birth year]  VISIT DATE:  03/14/2024     CC:  ***    Patient seen in consultation at the request of Manus Sellers, FNP ***  History obtained from the patient and chart/records  Age of patient:  48 y.o.    I had the pleasure of seeing Jasmine Brown for outpatient consultation, who is a 48 y.o. year old female and is being seen for management of above CC.    HPI:      03/14/2024  ***    =====================================================================  PMHx  Patient Active Problem List   Diagnosis    Post herpetic neuralgia    CVA (cerebral vascular accident)    Migraines    Hydrocephalus    Pseudoseizures    Benzodiazepine dependence (CMS HCC)    Seizure-like activity (CMS HCC)    Tobacco use disorder    Slurred speech    Cognitive decline     Past Surgical History:   Procedure Laterality Date    DENTAL SURGERY      HX DILATION AND CURETTAGE           Family Medical History:       Problem Relation (Age of Onset)    Breast Cancer Mother (36), Maternal Aunt, Paternal Aunt    Cancer Paternal Uncle    Diabetes Mother, Paternal Grandmother    Heart Attack Maternal Grandfather, Paternal Grandfather    Hypertension (High Blood Pressure) Mother    Mental illness Mother    No Known Problems Father    Other Maternal Grandfather, Paternal Grandmother    Seizures Maternal Grandmother    Stroke Mother, Maternal Grandmother, Maternal Grandfather    Tremor Mother            Current Outpatient Medications   Medication Sig Dispense  Refill    aspirin (ECOTRIN) 81 mg Oral Tablet, Delayed Release (E.C.) Take 2 Tablets (162 mg total) by mouth Once a day      baclofen (LIORESAL) 10 mg Oral Tablet Take 1 Tablet (10 mg total) by mouth Three times a day as needed      DULoxetine  (CYMBALTA  DR) 60 mg Oral Capsule, Delayed Release(E.C.) Take 1 Capsule (60 mg total) by mouth Once a day      ezetimibe (ZETIA) 10 mg Oral Tablet Take 1 Tablet (10 mg total) by mouth Every night      lidocaine  (XYLOCAINE ) 2 % Mucous Membrane jelly lidocaine  HCl 2 % mucosal jelly   Apply to affected area 3-4 times daily as needed.      LORazepam  (ATIVAN ) 0.5 mg Oral Tablet Take 1 Tablet (0.5 mg total) by mouth Twice daily      magnesium  oxide 400 mg magnesium  Oral Tablet Take 1 Tab (400 mg total) by mouth Once a day (Patient taking differently: Take 1 Tablet (400 mg total) by mouth Twice daily)      mupirocin (BACTROBAN) 2 % Ointment APPLY A SMALL AMOUNT TO LABIA 2-3 TIMES AS NEEDED  omeprazole (PRILOSEC) 20 mg Oral Capsule, Delayed Release(E.C.) Take 1 Capsule (20 mg total) by mouth Once a day      spironolactone  (ALDACTONE ) 100 mg Oral Tablet Take 1 Tablet (100 mg total) by mouth Every Monday, Wednesday and Friday      topiramate  (TOPAMAX ) 100 mg Oral Tablet TAKE 3 TABLETS BY MOUTH EVERY NIGHT AT BEDTIME (Patient taking differently: 1 Tablet (100 mg total) TAKE 3 TABLETS BY MOUTH EVERY NIGHT AT BEDTIME) 90 Tablet 11     No current facility-administered medications for this visit.     Allergies   Allergen Reactions    Aimovig Autoinjector [Erenumab-Aooe]     Bactrim [Sulfamethoxazole-Trimethoprim]     Codeine     Furosemide     Hctz [Hydrochlorothiazide]     Maxidex [Dexamethasone]     Sulfa (Sulfonamides)     Triamterene     Trimethoprim      Social History     Socioeconomic History    Marital status: Divorced     Spouse name: Chasey Dull    Number of children: 2    Years of education: 12    Highest education level: Not on file   Occupational History    Occupation: [employer]   Tobacco Use    Smoking status: Every Day     Current packs/day: 1.00     Types: Cigarettes    Smokeless tobacco: Never   Vaping Use    Vaping status: Every Day    Substances: THC   Substance and Sexual Activity    Alcohol use: Not Currently    Drug use: Never    Sexual activity: Not on file   Other Topics Concern    Not on file   Social History Narrative    Not on file     Social Determinants of Health     Financial Resource Strain: Not on file   Transportation Needs: Not on file   Social Connections: Not on file   Intimate Partner Violence: Not on file   Housing Stability: Not on file       =====================================================================  GENERAL EXAMINATION  There were no vitals taken for this visit.    ***  Vital signs personally reviewed    General: No acute distress, alert  HEENT: Normocephalic, no scleral icterus  Pulmonary: No accessory muscle use, no tachypnea  Cardiovascular: Heart with regular rate & rhythm  Extremities: No significant edema, No cyanosis    NEUROLOGIC EXAM  MENTAL STATUS: alert and oriented to ***; speech clear and fluent with good repetition and comprehension; naming intact; attention/concentration normal; recent and remote memory intact with normal fund of knowledge; able to follow simple and complex axial and appendicular commands without L/R confusion.    CN  II: not directly tested, grossly intact  III, IV, VI: extraocular movements intact without nystagmus  V: intact to light touch  VII: face symmetric without weakness  VIII: grossly intact  IX, X: symmetric palatal elevation  XI: normal strength of trapezius and sternocleidomastoid bilaterally  XII: tongue midline with full movements    MOTOR  Bulk: normal  Tone: normal  Abnormal Movements: none  Fasciculations: none    Strength: Patient moving all extremities symmetrically and against gravity with good strength.    Reflexes: ***    Sensory: Intact to Light Touch     Coordination: ***    Gait:  ***    =====================================================================  DATA  Personal review of prior labs is notable for: ***  VITAMIN B 12   Date Value Ref Range Status   10/09/2019 >2,000 (H) 200-1,000 pg/mL Final     Personal review of imaging (with independent interpretation) is notable for: ***  Personal Review of other prior diagnostics is notable for: ***  =====================================================================    Orders  No orders of the defined types were placed in this encounter.

## 2024-03-20 ENCOUNTER — Encounter (INDEPENDENT_AMBULATORY_CARE_PROVIDER_SITE_OTHER): Payer: Self-pay | Admitting: NEUROLOGY

## 2024-03-20 ENCOUNTER — Other Ambulatory Visit: Payer: Self-pay

## 2024-03-20 ENCOUNTER — Ambulatory Visit: Attending: NEUROLOGY

## 2024-03-20 DIAGNOSIS — G259 Extrapyramidal and movement disorder, unspecified: Secondary | ICD-10-CM | POA: Insufficient documentation

## 2024-03-20 DIAGNOSIS — R930 Abnormal findings on diagnostic imaging of skull and head, not elsewhere classified: Secondary | ICD-10-CM | POA: Insufficient documentation

## 2024-03-20 DIAGNOSIS — E559 Vitamin D deficiency, unspecified: Secondary | ICD-10-CM | POA: Insufficient documentation

## 2024-03-20 DIAGNOSIS — F445 Conversion disorder with seizures or convulsions: Secondary | ICD-10-CM | POA: Insufficient documentation

## 2024-03-20 LAB — VITAMIN D 25 TOTAL: VITAMIN D 25, TOTAL: 28 ng/mL — ABNORMAL LOW (ref 30.00–100.00)

## 2024-03-24 ENCOUNTER — Encounter (INDEPENDENT_AMBULATORY_CARE_PROVIDER_SITE_OTHER): Payer: Self-pay | Admitting: NEUROLOGY

## 2024-03-25 ENCOUNTER — Encounter (INDEPENDENT_AMBULATORY_CARE_PROVIDER_SITE_OTHER): Payer: Self-pay | Admitting: NEUROLOGY

## 2024-03-25 ENCOUNTER — Ambulatory Visit (INDEPENDENT_AMBULATORY_CARE_PROVIDER_SITE_OTHER): Payer: Medicare Other | Admitting: NEUROLOGY

## 2024-03-26 ENCOUNTER — Encounter (INDEPENDENT_AMBULATORY_CARE_PROVIDER_SITE_OTHER): Payer: Self-pay | Admitting: NEUROLOGY

## 2024-03-26 MED ORDER — CHOLECALCIFEROL (VITAMIN D3) 125 MCG (5,000 UNIT) CAPSULE
5000.0000 [IU] | ORAL_CAPSULE | Freq: Every day | ORAL | 2 refills | Status: AC
Start: 2024-03-26 — End: ?

## 2024-03-28 ENCOUNTER — Ambulatory Visit (INDEPENDENT_AMBULATORY_CARE_PROVIDER_SITE_OTHER): Payer: Self-pay | Admitting: NEUROLOGY

## 2024-04-24 ENCOUNTER — Ambulatory Visit
Admission: RE | Admit: 2024-04-24 | Discharge: 2024-04-24 | Disposition: A | Discharge status: Home / Self Care | Payer: Self-pay | Source: Ambulatory Visit | Attending: NEUROLOGY | Admitting: NEUROLOGY

## 2024-04-24 ENCOUNTER — Other Ambulatory Visit: Payer: Self-pay

## 2024-04-24 DIAGNOSIS — G259 Extrapyramidal and movement disorder, unspecified: Secondary | ICD-10-CM | POA: Insufficient documentation

## 2024-04-24 DIAGNOSIS — F445 Conversion disorder with seizures or convulsions: Secondary | ICD-10-CM | POA: Insufficient documentation

## 2024-04-24 DIAGNOSIS — R930 Abnormal findings on diagnostic imaging of skull and head, not elsewhere classified: Secondary | ICD-10-CM | POA: Insufficient documentation

## 2024-04-24 DIAGNOSIS — R9082 White matter disease, unspecified: Secondary | ICD-10-CM

## 2024-04-24 MED ORDER — GADOBUTROL 10 MMOL/10 ML (1 MMOL/ML) INTRAVENOUS SOLUTION
10.0000 mL | INTRAVENOUS | Status: AC
Start: 2024-04-24 — End: 2024-04-24
  Administered 2024-04-24: 4.5 mL via INTRAVENOUS

## 2024-05-21 ENCOUNTER — Encounter (INDEPENDENT_AMBULATORY_CARE_PROVIDER_SITE_OTHER): Payer: Self-pay | Admitting: NEUROLOGY

## 2024-05-21 NOTE — Telephone Encounter (Signed)
 Patient requesting a local PCP as transition from her current.

## 2024-06-16 ENCOUNTER — Other Ambulatory Visit (HOSPITAL_COMMUNITY): Payer: Self-pay | Admitting: NURSE PRACTITIONER

## 2024-06-16 DIAGNOSIS — Z1231 Encounter for screening mammogram for malignant neoplasm of breast: Secondary | ICD-10-CM

## 2024-06-20 ENCOUNTER — Encounter (HOSPITAL_COMMUNITY): Payer: Self-pay | Admitting: PHYSICIAN ASSISTANT

## 2024-06-27 ENCOUNTER — Ambulatory Visit
Admission: RE | Admit: 2024-06-27 | Discharge: 2024-06-27 | Disposition: A | Discharge status: Home / Self Care | Source: Ambulatory Visit | Attending: NURSE PRACTITIONER | Admitting: NURSE PRACTITIONER

## 2024-06-27 ENCOUNTER — Other Ambulatory Visit: Payer: Self-pay

## 2024-06-27 ENCOUNTER — Encounter (HOSPITAL_COMMUNITY): Payer: Self-pay

## 2024-06-27 DIAGNOSIS — Z1231 Encounter for screening mammogram for malignant neoplasm of breast: Secondary | ICD-10-CM | POA: Insufficient documentation

## 2024-07-13 ENCOUNTER — Other Ambulatory Visit: Payer: Self-pay

## 2024-07-13 ENCOUNTER — Emergency Department
Admission: EM | Admit: 2024-07-13 | Discharge: 2024-07-14 | Disposition: A | Discharge status: Home / Self Care | Attending: NURSE PRACTITIONER | Admitting: NURSE PRACTITIONER

## 2024-07-13 DIAGNOSIS — M542 Cervicalgia: Secondary | ICD-10-CM | POA: Insufficient documentation

## 2024-07-13 DIAGNOSIS — M62838 Other muscle spasm: Secondary | ICD-10-CM | POA: Insufficient documentation

## 2024-07-13 NOTE — ED Triage Notes (Signed)
 Neck pain, spasms, radiating down right side x24 hours.  Nausea.

## 2024-07-13 NOTE — ED APP Handoff Note (Signed)
 [facility] - Emergency Department  Emergency Department  Provider in Triage Note    Name: Jasmine Brown  Age: 48 y.o.  Gender: female     Subjective:   Jasmine Brown is a 48 y.o. female who presents with complaint of Neck Pain and Dizziness  .  Pt reports muscle spasm that started in her right neck and radiates down her arm with associated nausea. Spasm has been present for over 24 hours. Follows with neuro for functional neurologic disorders.    Objective:   Filed Vitals:    07/13/24 2237   BP: 97/79   Pulse: 87   Resp: 18   Temp: 36.6 C (97.8 F)   SpO2: 98%      Focused Physical Exam shows WNWD female pt    Assessment:  A medical screening exam was completed.  This patient is a 48 y.o. female with initial findings showing right neck pain    Plan:  Please see initial orders and work-up below.  This is to be continued with full evaluation in the main Emergency Department.     No current facility-administered medications for this encounter.     No results found for this or any previous visit (from the past 24 hours).     [place], PA-C  07/13/2024, 22:36

## 2024-07-14 MED ORDER — METHYLPREDNISOLONE SOD SUCC 125 MG SOLUTION FOR INJECTION WRAPPER
125.0000 mg | INTRAVENOUS | Status: AC
Start: 2024-07-14 — End: 2024-07-15
  Administered 2024-07-14: 125 mg via INTRAVENOUS

## 2024-07-14 MED ORDER — SODIUM CHLORIDE 0.9 % IV BOLUS
1000.0000 mL | INJECTION | Status: AC
Start: 2024-07-14 — End: 2024-07-14
  Administered 2024-07-14: 0 mL via INTRAVENOUS
  Administered 2024-07-14: 1000 mL via INTRAVENOUS

## 2024-07-14 MED ORDER — SODIUM CHLORIDE 0.9 % (FLUSH) INJECTION SYRINGE
3.0000 mL | INJECTION | INTRAMUSCULAR | Status: DC | PRN
Start: 2024-07-14 — End: 2024-07-14

## 2024-07-14 MED ORDER — KETOROLAC 30 MG/ML (1 ML) INJECTION SOLUTION
INTRAMUSCULAR | Status: AC
Start: 2024-07-14 — End: 2024-07-14
  Filled 2024-07-14: qty 1

## 2024-07-14 MED ORDER — SODIUM CHLORIDE 0.9 % (FLUSH) INJECTION SYRINGE
3.0000 mL | INJECTION | Freq: Three times a day (TID) | INTRAMUSCULAR | Status: DC
Start: 2024-07-14 — End: 2024-07-14

## 2024-07-14 MED ORDER — SODIUM CHLORIDE 0.9% FLUSH BAG - 250 ML
INTRAVENOUS | Status: DC | PRN
Start: 2024-07-14 — End: 2024-07-14

## 2024-07-14 MED ORDER — METHOCARBAMOL 100 MG/ML INJECTION SOLUTION
INTRAMUSCULAR | Status: AC
Start: 2024-07-14 — End: 2024-07-14
  Filled 2024-07-14: qty 10

## 2024-07-14 MED ORDER — DEXTROSE 5% IN WATER (D5W) FLUSH BAG - 250 ML
INTRAVENOUS | Status: DC | PRN
Start: 2024-07-14 — End: 2024-07-14

## 2024-07-14 MED ORDER — METHOCARBAMOL 100 MG/ML INJECTION SOLUTION
1.0000 g | INTRAMUSCULAR | Status: AC
Start: 2024-07-14 — End: 2024-07-15
  Administered 2024-07-14: 1000 mg via INTRAVENOUS

## 2024-07-14 MED ORDER — METHYLPREDNISOLONE SOD SUCC 125 MG SOLUTION FOR INJECTION WRAPPER
INTRAVENOUS | Status: AC
Start: 2024-07-14 — End: 2024-07-14
  Filled 2024-07-14: qty 2

## 2024-07-14 MED ORDER — NAPROXEN 500 MG TABLET
500.0000 mg | ORAL_TABLET | Freq: Two times a day (BID) | ORAL | 0 refills | Status: DC
Start: 2024-07-14 — End: 2024-09-24

## 2024-07-14 MED ORDER — KETOROLAC 30 MG/ML (1 ML) INJECTION SOLUTION
15.0000 mg | INTRAMUSCULAR | Status: AC
Start: 2024-07-14 — End: 2024-07-15
  Administered 2024-07-14: 15 mg via INTRAVENOUS

## 2024-07-14 MED ORDER — METHOCARBAMOL 500 MG TABLET
500.0000 mg | ORAL_TABLET | Freq: Three times a day (TID) | ORAL | 0 refills | Status: AC
Start: 2024-07-14 — End: 2024-07-24

## 2024-07-14 NOTE — ED Nurses Note (Signed)

## 2024-07-14 NOTE — ED Notes (Signed)
 [facility] - Emergency Department  Peer Recovery Coach Assessment    Initial Evaluation  Referred by:: Nurse  Location of Evaluation: Emergency Department  How many times in the last 12 months have you been to the ED?: 2  Have you ever served or are you currently serving in the Armed Forces?: No             Substance Use History  Patient current substance use status: Missed pt on 8/17 @ 10:34pm. Patient self disclosed marijuana use within the past 12 months. Safe marijuana practices, harm reduction and long term use shared with patient.                                  Family, Social, Home & Safety History                                                 Employment            Engagement  Readiness ruler: 1    Brief Intervention  Discussed plan to reduce/quit substance use?: Yes  Discussed willingness to enter treatment?: No  Indicated patient's stage of change:: 1 - Precontemplation    Patient seen by Peer Recovery Coach and is a candidate for buprenorphine administration in the ED. Patient needs assessment for bup treatment.: No    Plan  Was the patient referred to treatment?: No    Was patient referred to physician for Buprenorphine Assessment in the ED?: No    Did patient receive Narcan in the ED?: No         Follow-up           Need for additional follow-up?: No       Hunter Free, Peer Recovery Coach 07/14/2024 11:31

## 2024-07-14 NOTE — ED Provider Notes (Signed)
 [facility]  Emergency Department  Advanced Practice Provider Note      CHIEF COMPLAINT  Chief Complaint   Patient presents with    Neck Pain     HISTORY OF PRESENT ILLNESS  Jasmine Brown, date of birth [birth year], is a 48 y.o. female who presented to the Emergency Department.    Patient is a 48 year old female, with a history of migraines, seizures, neuropathy and muscle spasms, who presents to the ED with complaint of neck pain.  Patient reports she has had right-sided neck pain for the last 2 days.  Denies any specific injury or trauma.  Denies any fever, chills.  Denies headache.  Does report some nausea but denies any vomiting or diarrhea.        PAST MEDICAL/SURGICAL/FAMILY/SOCIAL HISTORY  Past Medical History:   Diagnosis Date    Anemia     Anxiety state     Breast mass     Multiple masses due to have a repeat 6 month Mammo 5/23    Cervical cancer     Depression     High cholesterol     HPV (human papilloma virus) infection     Migraines     Multiple lesions on CT of brain and spine     Muscle spasm     Neuropathy (CMS HCC)     tendons and muscles are fading away    Seizures (CMS HCC)     Stroke        Past Surgical History:   Procedure Laterality Date    DENTAL SURGERY      HX DILATION AND CURETTAGE         Family Medical History:       Problem Relation (Age of Onset)    Breast Cancer Mother (69), Maternal Aunt, Paternal Aunt    Cancer Paternal Uncle    Diabetes Mother, Paternal Grandmother    Heart Attack Maternal Grandfather, Paternal Grandfather    Hypertension (High Blood Pressure) Mother    Mental illness Mother    No Known Problems Father    Other Maternal Grandfather, Paternal Grandmother    Seizures Maternal Grandmother    Stroke Mother, Maternal Grandmother, Maternal Grandfather    Tremor Mother          Social History     Socioeconomic History    Marital status: Divorced     Spouse name: Sharalyn Lomba    Number of children: 2    Years of education: 12   Occupational History     Occupation: Real Estate   Tobacco Use    Smoking status: Every Day     Current packs/day: 1.00     Types: Cigarettes    Smokeless tobacco: Never   Vaping Use    Vaping status: Every Day    Substances: THC   Substance and Sexual Activity    Alcohol use: Not Currently    Drug use: Never      ALLERGIES  Allergies[1]        PHYSICAL EXAM  VITAL SIGNS:  Filed Vitals:    07/14/24 0115 07/14/24 0130 07/14/24 0135 07/14/24 0140   BP: (!) 78/43 (!) 81/43 97/61 103/62   Pulse:       Resp:       Temp:       SpO2: 97% 98% 99% 99%     Constitutional: Awake. Generally healthy appearing.  Average body weight. No distress noted.   HEENT:   Head: Normocephalic and  atraumatic.   Mouth/Throat: Oropharynx pink and moist. No postnasal drainage, erythema or exudates.   Eyes: EOMI, PERRL   Nose:External nose normal, no drainage  Neck: R side, trapezius muscle spasm.   Cardiovascular: Regular rate. No murmur or gallop heard.   Pulmonary/Chest: Breath sounds clear and equal bilaterally. No wheezes, rales or chest tenderness. No respiratory distress.   Musculoskeletal: R trapezius and upper back tenderness. Limited ROM to neck  Skin: warm and dry. No rash, redness, or bruising  Psychiatric: normal mood and affect. Behavior is normal.   Neurological: Alert, oriented. Normal gait. No focal weakness noted. No sensory deficit    Nursing notes reviewed.       DIAGNOSTICS  Labs:  Labs listed below were reviewed and interpreted by me.  No results found for any visits on 07/13/24.  Radiology:         ED COURSE/MEDICAL DECISION MAKING  Medications Ordered/Administered in the ED   NS flush syringe (has no administration in time range)   NS flush syringe (has no administration in time range)   NS 250 mL flush bag (has no administration in time range)     And   D5W 250 mL flush bag (has no administration in time range)   NS bolus infusion 1,000 mL (1,000 mL Intravenous New Bag/New Syringe 07/14/24 0058)   methylPREDNISolone  sod succ (SOLU-medrol ) 125  mg/2 mL injection (125 mg Intravenous Given 07/14/24 0022)   methocarbamol  (ROBAXIN ) 100 mg/mL injection (1,000 mg Intravenous Given 07/14/24 0022)   ketorolac  (TORADOL ) 30 mg/mL injection (15 mg Intravenous Given 07/14/24 0022)          Medical Decision Making  Patient is a 48 year old female, with a history of migraines, seizures, neuropathy and muscle spasms, who presents to the ED with complaint of neck pain.  Patient reports she has had right-sided neck pain for the last 2 days.  Denies any specific injury or trauma.  Denies any fever, chills.  Denies headache.  Does report some nausea but denies any vomiting or diarrhea.      Differential diagnosis includes but is not limited to muscle spasm, cervical strain, torticollis, meningitis    Patient reports she is feeling much better after receiving IV Solu-Medrol , Robaxin  and Toradol .  Reports she is able to move her neck better and has better range of motion.  Patient will be discharged with a prescription for Robaxin  and naproxen  to use as needed.  Patient to follow up with her neurologist as scheduled.  Given strict return to ED precautions for any new or worsening symptoms.  Patient verbalizes understanding and agrees to this plan of care.    Problems Addressed:  Neck muscle spasm: acute illness or injury     Details: Improved after IV Robaxin , Solu-medrol , Toradol   Neck pain on right side: acute illness or injury     Details: Improved after IV Robaxin , Solu-medrol , Toradol . Discharged with prescription for Naproxen  and Robaxin      Risk  Prescription drug management.  Risk Details: Contents of the document, in whole or in part, are completed utilizing M*Modal dictation technology, please forgive any typographical errors that may exist.           CLINICAL IMPRESSION  Clinical Impression   Neck pain on right side (Primary)   Neck muscle spasm     DISPOSITION  Discharged       DISCHARGE MEDICATIONS  Discharge Medication List as of 07/14/2024  1:45 AM        START  taking these medications    Details   methocarbamoL  (ROBAXIN ) 500 mg Oral Tablet Take 1 Tablet (500 mg total) by mouth Three times a day for 10 days, Disp-30 Tablet, R-0, E-Rx      naproxen  (NAPROSYN ) 500 mg Oral Tablet Take 1 Tablet (500 mg total) by mouth Twice daily with food, Disp-30 Tablet, R-0, E-Rx               Alleen Clause- FNP-C, ENP-C 07/14/2024, 01:45   [facility]  Department of Emergency Medicine  [place]    This note was partially generated using MModal Fluency Direct system, and there may be some incorrect words, spellings, and punctuation that were not noted in checking the note before saving.    -----             [1]   Allergies  Allergen Reactions    Aimovig Autoinjector [Erenumab-Aooe]     Bactrim [Sulfamethoxazole-Trimethoprim]     Codeine     Furosemide     Hctz [Hydrochlorothiazide]     Maxidex [Dexamethasone]     Sulfa (Sulfonamides)     Triamterene     Trimethoprim

## 2024-07-14 NOTE — Discharge Instructions (Signed)
 Continue to monitor symptoms. Take medications as prescribed.  Make sure you are staying hydrated with plenty of fluids.  Get plenty of rest.  Follow-up with your family doctor within 48-72 hours.  Return to ER immediately for any new or worsening symptoms.    Please ensure all questions or concerns are addressed prior to leaving the hospital. We want to make sure your concerns are addressed to make sure you are as safe and healthy as possible. By leaving the hospital, it is understood you are in agreement with your treatment plan.    Please discuss all medications with your pharmacist to ensure there are no concerns of interactions.    Thank you for allowing [place] to be part of your care.

## 2024-07-14 NOTE — ED Nurses Note (Signed)
 Pt to ED20 with c/o muscle spasming in neck/upper back for over 24 hours. Pt reports hx pf musculoskeletal problems and follows with neurology. Assessment complete. Pt placed on BP and pulse ox monitors. 20g PIV placed in left AC area and medications administered per MAR. Call light within reach. No further needs identified at this time.

## 2024-07-18 ENCOUNTER — Encounter (INDEPENDENT_AMBULATORY_CARE_PROVIDER_SITE_OTHER): Payer: Self-pay | Admitting: NEUROLOGY

## 2024-07-21 ENCOUNTER — Telehealth (INDEPENDENT_AMBULATORY_CARE_PROVIDER_SITE_OTHER): Payer: Self-pay | Admitting: HEMATOLOGY-ONCOLOGY

## 2024-08-01 ENCOUNTER — Telehealth (HOSPITAL_COMMUNITY): Payer: Self-pay

## 2024-08-01 NOTE — Telephone Encounter (Signed)
 CM scheduled the patient for a 1 pm NPV appointment with Dr. Mabel Quick PsyD for 10.2.25 in regard to the [facility].    Janelie Goltz, CASE MANAGER

## 2024-08-19 ENCOUNTER — Other Ambulatory Visit: Payer: Self-pay

## 2024-08-19 ENCOUNTER — Encounter (INDEPENDENT_AMBULATORY_CARE_PROVIDER_SITE_OTHER): Payer: Self-pay | Admitting: NEUROLOGY

## 2024-08-19 ENCOUNTER — Ambulatory Visit (INDEPENDENT_AMBULATORY_CARE_PROVIDER_SITE_OTHER): Admitting: NEUROLOGY

## 2024-08-19 VITALS — BP 104/66 | HR 73 | Temp 98.4°F | Wt 111.0 lb

## 2024-08-19 DIAGNOSIS — R202 Paresthesia of skin: Secondary | ICD-10-CM

## 2024-08-19 DIAGNOSIS — G379 Demyelinating disease of central nervous system, unspecified: Secondary | ICD-10-CM

## 2024-08-19 NOTE — Progress Notes (Signed)
 NEUROLOGY, [facility]  [address]  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 These have been present to varying degrees for a number of years. Patient reports significant improvement with steroid treatment (methylprednisolone ) as well as with large volume lumbar punctures. Previous workup included have included CSF studies which have only been notable for increase in kappa free light chains but no oligoclonal banding specific to the CSF.  She has had an MRI of the brain in 2020 which clearly shows a small enhancing lesion in the right frontal lobe which resolved on later studies.  While I think the possibility of the neuro immunologic process still exists, I do not think it would explain the constellation of her symptoms which have no clear localizing features.  I do think it is reasonable for her to seek a 2nd opinion with neuro immunology given her previous imaging and CSF findings.  She would prefer to go to [facility].  Encouraged follow-up with [facility] clinic  Refer to neuroimmunology at [facility]   Would consider repeat tap for re-evaluation of OCBs  Obtain NCS/EMG given reports of prior abnormalities  Return to clinic for electrodiagnostic testing      Thank you for allowing me to participate in your patient's care and please do not hesitate to contact me for any questions or concerns.    On the day of the encounter, a total of 40 minutes was spent on this patient encounter including review of historical information, examination, documentation and post-visit activities. The time documented excludes procedural time.    [name], DO  Assistant Professor of Neurology  [facility]    [id number]: I and/or [facility] Neurology provider will continue to be the provider focal point in managing the chronic complex neurological condition    =====================================================================    NAME:  Jasmine Brown  DOB:  Oct 20, 1976  VISIT DATE:  08/19/2024     CC:  Seizure-like activity, abnormal movements, abnormal MRI    Patient seen in consultation at the request of Delilah Gammon, FNP   History obtained from the patient and chart/records  Age of patient:  48 y.o.    I had the pleasure of seeing Jasmine Brown for outpatient consultation, who is a 48 y.o. year old female and is being seen for management of above CC.    INTERVAL:  Since last visit, she feels that symptoms are worsening.  She is requesting a lumbar puncture, as they have improved her symptoms in the past.  Gait continues to be an issue.  She has also had recurrence of urinary incontinence which has been an issue in the past.  Tremor and abnormal hand movements are not significant at this point.  She does plan to follow up with [facility] clinic in [address].    HPI:      03/14/2024  Patient presents to neurology with multiple neurological symptoms including abnormal movements, balance issues, and cognitive difficulties. She reports a complex history of neurological symptoms that began suddenly without a clear inciting event.    The patient describes experiencing leg and arm movements, arm cramping, and abnormal movements. She also reports significant cognitive issues, including brain fog, difficulty forming thoughts, and speaking sentences backwards. For example, she may say cake cup instead of cupcake  without realizing it. 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    Patient reports a history of seizure-like episodes, though she has not had a seizure in 2 years until recently. She does not lose  consciousness during these episodes. She also experiences balance issues and depth perception problems, which have caused her to lose her legs several times in the past 3 weeks. She requires a shower chair and occasionally needs assistance to prevent falls.    - Symptoms improved with steroid treatment in the past, received every 3 months  - Progressed from using a walker to rarely needing a cane  - Has not received steroids for past 3 weeks with worsening symptoms  - Reports history of migraines and vitamin D  deficiency  - Diagnosed with hydrocephalus by previous neurologist  - Denies any traumatic event triggering symptoms    Medical History  - Functional neurologic disorder  - Post-traumatic stress disorder (PTSD)  - Migraines  - Vitamin D  deficiency  - Anemia  - Hydrocephalus  - Multiple hospitalizations with up to 18 seizures per day  - History of using walker, progressing to cane    Medications and Supplements  - Cymbalta   - Valium   - Muscle relaxers  - Methylprednisolone  pack  - Topamax  (discontinued)  - Vitamin D  supplements    Allergies  - Topamax     Family History  - Brother: Currently undergoing Agent Orange testing, described as sick    Social History  - Occupation: Previously worked as a IT consultant for 22 years  - Trauma History: Patient mentions PTSD    Review of Systems  - General: Fatigue, weight loss  - HEENT: Difficulty rinsing hair and washing face due to balance issues  - Musculoskeletal: Leg weakness, arm cramping, abnormal movements, loss of balance  - Neurological: Seizure-like episodes, brain fog, word reversal, difficulty with math and number comprehension  - Psychiatric: Mood changes    =====================================================================  PMHx  Patient Active Problem List   Diagnosis    Post herpetic neuralgia    CVA (cerebral vascular accident)    Migraines    Hydrocephalus    Pseudoseizures    Benzodiazepine dependence (CMS HCC)    Seizure-like activity (CMS HCC)     Tobacco use disorder    Slurred speech    Cognitive decline    Functional movement disorder    Nonepileptic attack disorder    Abnormal MRI of head    Vitamin D  deficiency     Past Surgical History:   Procedure Laterality Date    DENTAL SURGERY      HX DILATION AND CURETTAGE      VULVA SURGERY           Family Medical History:       Problem Relation (Age of Onset)    Breast Cancer Mother (45), Maternal Aunt, Paternal Aunt    Cancer Paternal Uncle    Diabetes Mother, Paternal Grandmother    Heart Attack Maternal Grandfather, Paternal Grandfather    Hypertension (High Blood Pressure) Mother    Mental illness Mother    No Known Problems Father    Other Maternal Grandfather, Paternal Grandmother    Seizures Maternal Grandmother    Stroke Mother, Maternal Grandmother, Maternal Grandfather    Tremor Mother            Current Outpatient Medications   Medication Sig Dispense Refill  aspirin (ECOTRIN) 81 mg Oral Tablet, Delayed Release (E.C.) Take 2 Tablets (162 mg total) by mouth Daily      baclofen (LIORESAL) 10 mg Oral Tablet Take 1 Tablet (10 mg total) by mouth Three times a day as needed      cholecalciferol , vitamin D3, 125 mcg (5,000 unit) Oral Capsule Take 1 Capsule (5,000 Units total) by mouth Daily 30 Capsule 2    divalproex  (DEPAKOTE ) 250 mg Oral Tablet, Delayed Release (E.C.) Take 1 Tablet (250 mg total) by mouth Twice daily (Patient not taking: Reported on 08/19/2024) 60 Tablet 2    DULoxetine  (CYMBALTA  DR) 60 mg Oral Capsule, Delayed Release(E.C.) Take 1 Capsule (60 mg total) by mouth Daily      ezetimibe (ZETIA) 10 mg Oral Tablet Take 1 Tablet (10 mg total) by mouth Every night      lidocaine  (XYLOCAINE ) 2 % Mucous Membrane jelly lidocaine  HCl 2 % mucosal jelly   Apply to affected area 3-4 times daily as needed. (Patient not taking: Reported on 08/19/2024)      LORazepam  (ATIVAN ) 0.5 mg Oral Tablet Take 1 Tablet (0.5 mg total) by mouth Twice daily      magnesium  oxide 400 mg magnesium  Oral Tablet Take 1 Tab  (400 mg total) by mouth Once a day      Methylprednisolone  (MEDROL  DOSEPACK) 4 mg Oral Tablets, Dose Pack Take by mouth      mupirocin (BACTROBAN) 2 % Ointment APPLY A SMALL AMOUNT TO LABIA 2-3 TIMES AS NEEDED (Patient not taking: Reported on 08/19/2024)      naproxen  (NAPROSYN ) 500 mg Oral Tablet Take 1 Tablet (500 mg total) by mouth Twice daily with food 30 Tablet 0    omeprazole (PRILOSEC) 20 mg Oral Capsule, Delayed Release(E.C.) Take 1 Capsule (20 mg total) by mouth Daily      risperiDONE (RISPERDAL) 0.25 mg Oral Tablet Take 1 Tablet (0.25 mg total) by mouth Twice daily      spironolactone  (ALDACTONE ) 100 mg Oral Tablet Take 1 Tablet (100 mg total) by mouth Every Monday, Wednesday and Friday       No current facility-administered medications for this visit.     Allergies   Allergen Reactions    Aimovig Autoinjector [Erenumab-Aooe]     Bactrim [Sulfamethoxazole-Trimethoprim]     Codeine     Furosemide     Hctz [Hydrochlorothiazide]     Maxidex [Dexamethasone]     Sulfa (Sulfonamides)     Triamterene     Trimethoprim      Social History     Socioeconomic History    Marital status: Divorced     Spouse name: Tanya Crothers    Number of children: 2    Years of education: 12    Highest education level: Not on file   Occupational History    Occupation: Real Estate   Tobacco Use    Smoking status: Every Day     Current packs/day: 1.00     Types: Cigarettes    Smokeless tobacco: Never   Vaping Use    Vaping status: Every Day    Substances: THC   Substance and Sexual Activity    Alcohol use: Not Currently    Drug use: Never    Sexual activity: Not on file   Other Topics Concern    Not on file   Social History Narrative    Not on file     Social Determinants of Health     Financial Resource Strain: Not on  file   Transportation Needs: Not on file   Social Connections: Not on file   Intimate Partner Violence: Not on file   Housing Stability: Not on file        =====================================================================  GENERAL EXAMINATION  BP 104/66 (Site: Left Arm, Patient Position: Sitting)   Pulse 73   Temp 36.9 C (98.4 F) (Temporal)   Wt 50.3 kg (111 lb)   SpO2 98%   BMI 22.42 kg/m     Vital signs personally reviewed    General: No acute distress, alert  HEENT: Normocephalic, no scleral icterus  Extremities: No significant edema, No cyanosis    NEUROLOGIC EXAM  MENTAL STATUS: alert and answering questions appropriately  CN  II: not directly tested, grossly intact  III, IV, VI: extraocular movements intact without nystagmus  V:  Reports diminished sensation throughout the right side to light touch  VII: face symmetric without weakness  VIII: grossly intact  IX, X: symmetric palatal elevation  XI: normal strength of trapezius and sternocleidomastoid bilaterally  XII: tongue midline with full movements    MOTOR  Bulk: normal  Tone: normal  Abnormal Movements:  No appreciable tremor today    Strength:  5/5 throughout there was some give-way weakness in the left side particularly    Reflexes: 2+ throughout    Sensory: Intact to Light Touch     Coordination: No dysmetria    Gait: no clear ataxia, does not wish to complete tandem gait    =====================================================================  DATA  Personal review of prior labs is notable for:   VITAMIN B 12   Date Value Ref Range Status   10/09/2019 >2,000 (H) 200-1,000 pg/mL Final     Personal review of imaging (with independent interpretation) is notable for:   MRI Brain w/wo May 2025 - minimal scattered white matter ds    - MRI brain with and without contrast 08/19/2019:  Punctate enhancing lesion of the right frontal lobe  - MRI brain with and without contrast 01/02/2020:  Resolution punctate lesion, few scattered punctate T2 FLAIR hyperintensities  - MRI C-spine with and without contrast 08/17/2019:  Unremarkable  - MRI T-spine with and without contrast 09/20/2019:  T5  hemangioma, otherwise unremarkable  Personal Review of other prior diagnostics is notable for: not available for review   =====================================================================    Orders  Orders Placed This Encounter    emg 2 limb Prior Auth/Scheduling Procedure    Referral to External Provider

## 2024-08-27 ENCOUNTER — Telehealth (HOSPITAL_COMMUNITY): Payer: Self-pay

## 2024-08-27 NOTE — Telephone Encounter (Signed)
 CM rescheduled the patient's appointment with Dr. Marilee PsyD that had been scheduled on 08/28/24 for 1 pm to 11/13/24 at 11 am.  This reschedule was made per patient request due to them having some surgical procedures closer to the original appointment date.    Deriana Vanderhoef, CASE MANAGER

## 2024-08-28 ENCOUNTER — Ambulatory Visit (INDEPENDENT_AMBULATORY_CARE_PROVIDER_SITE_OTHER): Payer: Self-pay | Admitting: Clinical Neuropsychologist

## 2024-09-11 ENCOUNTER — Other Ambulatory Visit (HOSPITAL_COMMUNITY): Payer: Self-pay | Admitting: NURSE PRACTITIONER

## 2024-09-11 ENCOUNTER — Other Ambulatory Visit: Payer: Self-pay

## 2024-09-11 ENCOUNTER — Ambulatory Visit (HOSPITAL_BASED_OUTPATIENT_CLINIC_OR_DEPARTMENT_OTHER)
Admission: RE | Admit: 2024-09-11 | Discharge: 2024-09-11 | Disposition: A | Discharge status: Home / Self Care | Source: Ambulatory Visit

## 2024-09-11 ENCOUNTER — Ambulatory Visit (HOSPITAL_COMMUNITY)
Admission: RE | Admit: 2024-09-11 | Discharge: 2024-09-11 | Disposition: A | Discharge status: Home / Self Care | Source: Ambulatory Visit

## 2024-09-11 ENCOUNTER — Ambulatory Visit (HOSPITAL_COMMUNITY)

## 2024-09-11 DIAGNOSIS — Z01818 Encounter for other preprocedural examination: Secondary | ICD-10-CM | POA: Insufficient documentation

## 2024-09-11 DIAGNOSIS — Z01810 Encounter for preprocedural cardiovascular examination: Secondary | ICD-10-CM

## 2024-09-11 DIAGNOSIS — Z7901 Long term (current) use of anticoagulants: Secondary | ICD-10-CM | POA: Insufficient documentation

## 2024-09-11 LAB — URINALYSIS, MACROSCOPIC
BILIRUBIN: NEGATIVE mg/dL
BLOOD: 0.03 mg/dL
GLUCOSE: NEGATIVE mg/dL
KETONES: NEGATIVE mg/dL
LEUKOCYTES: NEGATIVE WBCs/uL
NITRITE: NEGATIVE
PH: 6.5 (ref 5.0–9.0)
PROTEIN: NEGATIVE mg/dL
SPECIFIC GRAVITY: 1.013 (ref 1.002–1.030)
UROBILINOGEN: NORMAL mg/dL

## 2024-09-11 LAB — CBC WITH DIFF
BASOPHIL #: 0.1 x10ˆ3/uL (ref 0.00–0.10)
BASOPHIL %: 1 % (ref 0–1)
EOSINOPHIL #: 0.3 x10ˆ3/uL (ref 0.00–0.50)
EOSINOPHIL %: 3 % (ref 1–7)
HCT: 47.2 % — ABNORMAL HIGH (ref 31.2–41.9)
HGB: 16.5 g/dL — ABNORMAL HIGH (ref 10.9–14.3)
LYMPHOCYTE #: 3.1 x10ˆ3/uL (ref 1.10–3.10)
LYMPHOCYTE %: 32 % (ref 16–46)
MCH: 35.4 pg — ABNORMAL HIGH (ref 24.7–32.8)
MCHC: 34.9 g/dL (ref 32.3–35.6)
MCV: 101.6 fL — ABNORMAL HIGH (ref 75.5–95.3)
MONOCYTE #: 1 x10ˆ3/uL — ABNORMAL HIGH (ref 0.20–0.90)
MONOCYTE %: 11 % (ref 4–11)
MPV: 8.5 fL (ref 7.9–10.8)
NEUTROPHIL #: 5.1 x10ˆ3/uL (ref 1.90–8.20)
NEUTROPHIL %: 54 % (ref 43–77)
PLATELETS: 243 x10ˆ3/uL (ref 140–440)
RBC: 4.65 x10ˆ6/uL (ref 3.63–4.92)
RDW: 13.7 % (ref 12.3–17.7)
WBC: 9.6 x10ˆ3/uL (ref 3.8–11.8)

## 2024-09-11 LAB — URINALYSIS, MICROSCOPIC
BACTERIA: NEGATIVE /HPF
RBCS: 9 /HPF — ABNORMAL HIGH (ref <4)
SQUAMOUS EPITHELIAL: 20 /HPF (ref <28)
WBCS: 1 /HPF (ref <6)

## 2024-09-11 LAB — BASIC METABOLIC PANEL
ANION GAP: 7 mmol/L (ref 4–13)
BUN/CREA RATIO: 8 (ref 6–22)
BUN: 6 mg/dL — ABNORMAL LOW (ref 7–25)
CALCIUM: 9.4 mg/dL (ref 8.6–10.3)
CHLORIDE: 105 mmol/L (ref 98–107)
CO2 TOTAL: 27 mmol/L (ref 21–31)
CREATININE: 0.72 mg/dL (ref 0.60–1.30)
ESTIMATED GFR: 103 mL/min/1.73mˆ2 (ref >59)
GLUCOSE: 90 mg/dL (ref 74–109)
OSMOLALITY, CALCULATED: 275 mosm/kg (ref 270–290)
POTASSIUM: 4.1 mmol/L (ref 3.5–5.1)
SODIUM: 139 mmol/L (ref 136–145)

## 2024-09-11 LAB — ECG 12 LEAD
Atrial Rate: 65 {beats}/min
Calculated P Axis: 73 degrees
Calculated R Axis: 78 degrees
Calculated T Axis: 76 degrees
PR Interval: 118 ms
QRS Duration: 70 ms
QT Interval: 390 ms
QTC Calculation: 405 ms
Ventricular rate: 65 {beats}/min

## 2024-09-11 LAB — PT/INR
INR: 0.91 (ref 0.84–1.10)
PROTHROMBIN TIME: 10.3 s (ref 9.8–12.7)

## 2024-09-16 ENCOUNTER — Ambulatory Visit: Admit: 2024-09-16 | Admitting: Obstetrics & Gynecology

## 2024-09-16 ENCOUNTER — Encounter (HOSPITAL_COMMUNITY): Payer: Self-pay

## 2024-09-16 SURGERY — BIOPSY ENDOMETRIUM
Anesthesia: Other

## 2024-09-22 ENCOUNTER — Other Ambulatory Visit: Payer: Self-pay

## 2024-09-22 ENCOUNTER — Ambulatory Visit: Attending: Student in an Organized Health Care Education/Training Program

## 2024-09-22 DIAGNOSIS — Z01818 Encounter for other preprocedural examination: Secondary | ICD-10-CM | POA: Insufficient documentation

## 2024-09-22 LAB — CBC
HCT: 45.2 % — ABNORMAL HIGH (ref 31.2–41.9)
HGB: 15.5 g/dL — ABNORMAL HIGH (ref 10.9–14.3)
MCH: 34.7 pg — ABNORMAL HIGH (ref 24.7–32.8)
MCHC: 34.4 g/dL (ref 32.3–35.6)
MCV: 100.9 fL — ABNORMAL HIGH (ref 75.5–95.3)
MPV: 8.4 fL (ref 7.9–10.8)
PLATELETS: 238 x10ˆ3/uL (ref 140–440)
RBC: 4.48 x10ˆ6/uL (ref 3.63–4.92)
RDW: 13.9 % (ref 12.3–17.7)
WBC: 8.5 x10ˆ3/uL (ref 3.8–11.8)

## 2024-09-22 LAB — BASIC METABOLIC PANEL
ANION GAP: 7 mmol/L (ref 4–13)
BUN/CREA RATIO: 10 (ref 6–22)
BUN: 7 mg/dL (ref 7–25)
CALCIUM: 9.3 mg/dL (ref 8.6–10.3)
CHLORIDE: 107 mmol/L (ref 98–107)
CO2 TOTAL: 24 mmol/L (ref 21–31)
CREATININE: 0.71 mg/dL (ref 0.60–1.30)
ESTIMATED GFR: 105 mL/min/1.73mˆ2 (ref >59)
GLUCOSE: 70 mg/dL — ABNORMAL LOW (ref 74–109)
OSMOLALITY, CALCULATED: 272 mosm/kg (ref 270–290)
POTASSIUM: 4.2 mmol/L (ref 3.5–5.1)
SODIUM: 138 mmol/L (ref 136–145)

## 2024-09-23 ENCOUNTER — Encounter (INDEPENDENT_AMBULATORY_CARE_PROVIDER_SITE_OTHER): Payer: Self-pay | Admitting: NEUROLOGY

## 2024-09-23 NOTE — Nursing Note (Signed)
 Called [facility] Neurology and there was no answer and no option to lvm

## 2024-09-24 ENCOUNTER — Ambulatory Visit (HOSPITAL_COMMUNITY)

## 2024-09-24 ENCOUNTER — Ambulatory Visit (HOSPITAL_COMMUNITY): Admitting: Student in an Organized Health Care Education/Training Program

## 2024-09-24 ENCOUNTER — Encounter (HOSPITAL_COMMUNITY): Payer: Self-pay | Admitting: Student in an Organized Health Care Education/Training Program

## 2024-09-24 ENCOUNTER — Encounter (HOSPITAL_COMMUNITY)
Admission: RE | Disposition: A | Payer: Self-pay | Source: Ambulatory Visit | Attending: Student in an Organized Health Care Education/Training Program

## 2024-09-24 ENCOUNTER — Ambulatory Visit
Admission: RE | Admit: 2024-09-24 | Discharge: 2024-09-24 | Disposition: A | Attending: Student in an Organized Health Care Education/Training Program | Admitting: Student in an Organized Health Care Education/Training Program

## 2024-09-24 ENCOUNTER — Other Ambulatory Visit: Payer: Self-pay

## 2024-09-24 DIAGNOSIS — N870 Mild cervical dysplasia: Secondary | ICD-10-CM | POA: Insufficient documentation

## 2024-09-24 DIAGNOSIS — N939 Abnormal uterine and vaginal bleeding, unspecified: Secondary | ICD-10-CM | POA: Insufficient documentation

## 2024-09-24 DIAGNOSIS — N903 Dysplasia of vulva, unspecified: Secondary | ICD-10-CM | POA: Insufficient documentation

## 2024-09-24 LAB — HCG, URINE QUALITATIVE, PREGNANCY: HCG URINE QUALITATIVE: NEGATIVE

## 2024-09-24 SURGERY — BIOPSY ENDOMETRIUM
Anesthesia: General | Site: Vagina | Wound class: Clean Contaminated Wounds-The respiratory, GI, Genital, or urinary

## 2024-09-24 MED ORDER — PHENYLEPHRINE 10 MG/ML INJECTION SOLUTION
Freq: Once | INTRAMUSCULAR | Status: DC | PRN
Start: 2024-09-24 — End: 2024-09-24
  Administered 2024-09-24 (×2): 100 ug via INTRAVENOUS

## 2024-09-24 MED ORDER — LACTATED RINGERS INTRAVENOUS SOLUTION
INTRAVENOUS | Status: DC
Start: 2024-09-24 — End: 2024-09-24

## 2024-09-24 MED ORDER — PROCHLORPERAZINE EDISYLATE 10 MG/2 ML (5 MG/ML) INJECTION SOLUTION
5.0000 mg | Freq: Once | INTRAMUSCULAR | Status: DC | PRN
Start: 2024-09-24 — End: 2024-09-24

## 2024-09-24 MED ORDER — FENTANYL (PF) 50 MCG/ML INJECTION WRAPPER
50.0000 ug | INJECTION | INTRAMUSCULAR | Status: DC | PRN
Start: 2024-09-24 — End: 2024-09-24

## 2024-09-24 MED ORDER — HYDROMORPHONE 2 MG/ML INJECTION WRAPPER
0.2000 mg | INJECTION | INTRAMUSCULAR | Status: DC | PRN
Start: 2024-09-24 — End: 2024-09-24

## 2024-09-24 MED ORDER — IODINE STRONG (LUGOLS) 5 % ORAL SOLN
ORAL | Status: AC
Start: 2024-09-24 — End: 2024-09-24
  Filled 2024-09-24: qty 14

## 2024-09-24 MED ORDER — SODIUM CHLORIDE 0.9 % (FLUSH) INJECTION SYRINGE
3.0000 mL | INJECTION | INTRAMUSCULAR | Status: DC | PRN
Start: 2024-09-24 — End: 2024-09-24

## 2024-09-24 MED ORDER — FENTANYL (PF) 50 MCG/ML INJECTION WRAPPER
25.0000 ug | INJECTION | INTRAMUSCULAR | Status: DC | PRN
Start: 2024-09-24 — End: 2024-09-24
  Administered 2024-09-24: 25 ug via INTRAVENOUS

## 2024-09-24 MED ORDER — KETOROLAC 30 MG/ML (1 ML) INJECTION SOLUTION
Freq: Once | INTRAMUSCULAR | Status: DC | PRN
Start: 2024-09-24 — End: 2024-09-24
  Administered 2024-09-24: 30 mg via INTRAVENOUS

## 2024-09-24 MED ORDER — PROCHLORPERAZINE EDISYLATE 10 MG/2 ML (5 MG/ML) INJECTION SOLUTION
10.0000 mg | Freq: Four times a day (QID) | INTRAMUSCULAR | Status: DC | PRN
Start: 2024-09-24 — End: 2024-09-24

## 2024-09-24 MED ORDER — ACETAMINOPHEN 500 MG TABLET
1000.0000 mg | ORAL_TABLET | Freq: Four times a day (QID) | ORAL | Status: AC | PRN
Start: 2024-09-24 — End: ?

## 2024-09-24 MED ORDER — SODIUM CHLORIDE 0.9 % (FLUSH) INJECTION SYRINGE
3.0000 mL | INJECTION | Freq: Three times a day (TID) | INTRAMUSCULAR | Status: DC
Start: 2024-09-24 — End: 2024-09-24

## 2024-09-24 MED ORDER — OXYCODONE 5 MG TABLET
5.0000 mg | ORAL_TABLET | Freq: Four times a day (QID) | ORAL | 0 refills | Status: AC | PRN
Start: 2024-09-24 — End: 2024-09-27

## 2024-09-24 MED ORDER — FENTANYL (PF) 50 MCG/ML INJECTION SOLUTION
INTRAMUSCULAR | Status: AC
Start: 2024-09-24 — End: 2024-09-24
  Filled 2024-09-24: qty 2

## 2024-09-24 MED ORDER — FERRIC SUBSULFATE 0.2 GRAM TO 0.22 GRAM/ML TOPICAL SOLN AND APPLICATOR
CUTANEOUS | Status: AC
Start: 2024-09-24 — End: 2024-09-24
  Filled 2024-09-24: qty 8

## 2024-09-24 MED ORDER — DEXTROSE 5% IN WATER (D5W) FLUSH BAG - 250 ML
INTRAVENOUS | Status: DC | PRN
Start: 2024-09-24 — End: 2024-09-24

## 2024-09-24 MED ORDER — OXYCODONE 5 MG TABLET
10.0000 mg | ORAL_TABLET | ORAL | Status: DC | PRN
Start: 2024-09-24 — End: 2024-09-24

## 2024-09-24 MED ORDER — PROPOFOL 10 MG/ML IV BOLUS
INJECTION | Freq: Once | INTRAVENOUS | Status: DC | PRN
Start: 2024-09-24 — End: 2024-09-24
  Administered 2024-09-24: 150 mg via INTRAVENOUS

## 2024-09-24 MED ORDER — ETHYL ALCOHOL 62 % TOPICAL SWAB
1.0000 | Freq: Once | CUTANEOUS | Status: AC
Start: 2024-09-24 — End: 2024-09-24
  Administered 2024-09-24: 1 via NASAL

## 2024-09-24 MED ORDER — OXYCODONE 5 MG TABLET
5.0000 mg | ORAL_TABLET | ORAL | Status: DC | PRN
Start: 2024-09-24 — End: 2024-09-24

## 2024-09-24 MED ORDER — ONDANSETRON HCL (PF) 4 MG/2 ML INJECTION SOLUTION
Freq: Once | INTRAMUSCULAR | Status: DC | PRN
Start: 2024-09-24 — End: 2024-09-24
  Administered 2024-09-24: 4 mg via INTRAVENOUS

## 2024-09-24 MED ORDER — MIDAZOLAM 5 MG/ML INJECTION WRAPPER
Freq: Once | INTRAMUSCULAR | Status: DC | PRN
Start: 2024-09-24 — End: 2024-09-24
  Administered 2024-09-24: 5 mg via INTRAVENOUS

## 2024-09-24 MED ORDER — IBUPROFEN 600 MG TABLET
600.0000 mg | ORAL_TABLET | Freq: Four times a day (QID) | ORAL | Status: AC
Start: 2024-09-24 — End: ?

## 2024-09-24 MED ORDER — FERRIC SUBSULFATE 0.2 GRAM TO 0.22 GRAM/ML TOPICAL SOLN AND APPLICATOR
Freq: Once | CUTANEOUS | Status: DC | PRN
Start: 2024-09-24 — End: 2024-09-24
  Administered 2024-09-24: 8 mL via TOPICAL

## 2024-09-24 MED ORDER — BENZOCAINE 20 %-MENTHOL 0.5 % TOPICAL AEROSOL
1.0000 | INHALATION_SPRAY | Freq: Once | CUTANEOUS | Status: AC
Start: 2024-09-24 — End: 2024-09-24
  Administered 2024-09-24: 1 via TOPICAL
  Filled 2024-09-24: qty 85

## 2024-09-24 MED ORDER — LIDOCAINE-EPINEPHRINE (PF) 1 %-1:100,000 INJECTION SOLUTION
Freq: Once | INTRAMUSCULAR | Status: DC | PRN
Start: 2024-09-24 — End: 2024-09-24
  Administered 2024-09-24: 7 mL via INTRAMUSCULAR

## 2024-09-24 MED ORDER — MIDAZOLAM 5 MG/ML INJECTION WRAPPER
INTRAMUSCULAR | Status: AC
Start: 2024-09-24 — End: 2024-09-24
  Filled 2024-09-24: qty 1

## 2024-09-24 MED ORDER — FENTANYL (PF) 50 MCG/ML INJECTION WRAPPER
INJECTION | Freq: Once | INTRAMUSCULAR | Status: DC | PRN
Start: 2024-09-24 — End: 2024-09-24
  Administered 2024-09-24 (×4): 25 ug via INTRAVENOUS

## 2024-09-24 MED ORDER — ALBUTEROL SULFATE 2.5 MG/3 ML (0.083 %) SOLUTION FOR NEBULIZATION
2.5000 mg | INHALATION_SOLUTION | Freq: Once | RESPIRATORY_TRACT | Status: DC | PRN
Start: 2024-09-24 — End: 2024-09-24

## 2024-09-24 MED ORDER — LIDOCAINE (PF) 100 MG/5 ML (2 %) INTRAVENOUS SYRINGE
INJECTION | Freq: Once | INTRAVENOUS | Status: DC | PRN
Start: 2024-09-24 — End: 2024-09-24
  Administered 2024-09-24: 60 mg via INTRAVENOUS

## 2024-09-24 MED ORDER — DEXAMETHASONE SODIUM PHOSPHATE 4 MG/ML INJECTION SOLUTION
Freq: Once | INTRAMUSCULAR | Status: DC | PRN
Start: 2024-09-24 — End: 2024-09-24
  Administered 2024-09-24: 4 mg via INTRAVENOUS

## 2024-09-24 MED ORDER — ONDANSETRON HCL (PF) 4 MG/2 ML INJECTION SOLUTION
4.0000 mg | Freq: Once | INTRAMUSCULAR | Status: DC | PRN
Start: 2024-09-24 — End: 2024-09-24

## 2024-09-24 MED ORDER — ACETAMINOPHEN 325 MG TABLET
650.0000 mg | ORAL_TABLET | ORAL | Status: DC | PRN
Start: 2024-09-24 — End: 2024-09-24

## 2024-09-24 MED ORDER — SODIUM CHLORIDE 0.9% FLUSH BAG - 250 ML
INTRAVENOUS | Status: DC | PRN
Start: 2024-09-24 — End: 2024-09-24

## 2024-09-24 MED ORDER — IODINE-POTASSIUM IODIDE 5 %-10 % TOPICAL SOLUTION
Freq: Once | CUTANEOUS | Status: DC | PRN
Start: 2024-09-24 — End: 2024-09-24
  Administered 2024-09-24: 5 mL via TOPICAL

## 2024-09-24 MED ORDER — IPRATROPIUM 0.5 MG-ALBUTEROL 3 MG (2.5 MG BASE)/3 ML NEBULIZATION SOLN
3.0000 mL | INHALATION_SOLUTION | Freq: Once | RESPIRATORY_TRACT | Status: DC | PRN
Start: 2024-09-24 — End: 2024-09-24

## 2024-09-24 SURGICAL SUPPLY — 24 items
BLADE 15 2 END CBNSTL SURG STRL DISP (SURGICAL CUTTING SUPPLIES) ×2 IMPLANT
BLADE SURG CLPR PVT ADJ HEAD 9661 STRL LF  DISP PURP (MED SURG SUPPLIES) ×2 IMPLANT
CATH URETH BARD 14FR FOLEY RND TIP INTMT AP RUB STRL LTX DISP RD (UROLOGICAL SUPPLIES) ×2 IMPLANT
CLEANER INSTRUMENT PRE-KLENZ 13.5 OZ (MISCELLANEOUS PT CARE ITEMS) ×2 IMPLANT
CONTAINR CLICKSEAL 4OZ TRANSLUC SCREW CAP STRL BLU SPECI PNEUM TUBE SYS (SPECIMEN COLLECTION SUPPLIES) ×12 IMPLANT
CONV USE ITEM 162446 - CATH URETH BARD 14FR FOLEY RND TIP INTMT AP RUB STRL LTX DISP RD (UROLOGICAL SUPPLIES) ×2 IMPLANT
COVER EQP 90X60IN HVDTY BCK PAD FNFLD BLU STRL (DRAPE/PACKS/SHEETS/OR TOWEL) ×2 IMPLANT
CURETTE SUCT RPD STRL DISP PIPELLE 3.1MM ENDOMETRIUM (SURGICAL CUTTING SUPPLIES) ×2 IMPLANT
ELECTRODE PATIENT RTN 9FT VLAB C30- LB RM PHSV ACRL FOAM CORD NONIRRITATE NONSENSITIZE ADH STRP (SURGICAL CUTTING SUPPLIES) ×2 IMPLANT
GLOVE SURG 6.5 LF  PF BEAD CUF STRL CRM 11.3IN PROTEXIS PI PLISPRN THK9.1 MIL (GLOVES AND ACCESSORIES) ×10 IMPLANT
GLOVE SURG 6.5 LF  PF SMOOTH BEAD CUF INTLK STRL BLU 11.3IN PROTEXIS NEU-THERA PLISPRN THK7.9 MIL (GLOVES AND ACCESSORIES) ×6 IMPLANT
GLOVE SURG 7 LF  PF BEAD CUF STRL CRM 11.8IN PROTEXIS PI PLISPRN THK9.1 MIL (GLOVES AND ACCESSORIES) ×4 IMPLANT
GLOVE SURG 7 LF  PF SMOOTH BEAD CUF INTLK STRL BLU 11.8IN PROTEXIS NEU-THERA PLISPRN THK7.9 MIL (GLOVES AND ACCESSORIES) ×2 IMPLANT
GOWN SURG XL STD LGTH AAMI L4 BRTHBL HKLP CLSR LINT RST STRL AERO CR SMS FILM (DRAPE/PACKS/SHEETS/OR TOWEL) ×2 IMPLANT
GOWN SURGICAL SIRUS SMS LG XLONG 52IN LEV 4 STRL LF DISP BLUE (GLOVES AND ACCESSORIES) ×2 IMPLANT
LABEL MED CORRECT MED LABELING SYS 4 FLG 2 SHEET 24 PRPRNT STRL (MED SURG SUPPLIES) ×2 IMPLANT
NEEDLE HYPO  23GA 1.5IN TW PRCSNGL SS POLYPROP REG BVL LL HUB DEHP-FR TRQS STRL LF  DISP (MED SURG SUPPLIES) ×2 IMPLANT
PACK SURG SIRUS OB III REINF TBL CVR GRAD UNDERBUTTOCK STRL 76X44IN 46X33.5IN POLY LF (MED SURG SUPPLIES) ×2 IMPLANT
PENCIL SMOKEVAC COAT PSHBTN LF (MED SURG SUPPLIES) ×2 IMPLANT
SPONGE SURG 4X4IN 16 PLY XRY DETECT COTTON STRL LF  DISP (WOUND CARE SUPPLY) ×2 IMPLANT
SUTURE 4-0 SH VICRYL+ 27IN VIOL BRD ANBCTRL COAT ABS (SUTURE/WOUND CLOSURE) ×2 IMPLANT
SWAB BBL CLTSWB LIQUID AMIES MED PLASTIC RYN 2 STRL CULT LF  DISP (SPECIMEN COLLECTION SUPPLIES) ×4 IMPLANT
TOWEL 24X16IN COTTON BLU DISP SURG STRL LF (DRAPE/PACKS/SHEETS/OR TOWEL) ×4 IMPLANT
TUBING SUCT CLR 6FT .25IN ARGYLE PVC NCDTV STR MALE FEMALE MLD CONN STRL LF (MED SURG SUPPLIES) ×2 IMPLANT

## 2024-09-24 NOTE — Discharge Instructions (Signed)
 FOLLOW UP AT DR SNYDER'S OFFICE ON NOVEMBER 12 AT 900 AM

## 2024-09-24 NOTE — Interval H&P Note (Signed)
 [facility]      H&P UPDATE FORM                                                                                  Jasmine Brown, Jasmine Brown, 48 y.o. female  Date of Admission:  09/24/2024  Date of Birth:  [birth year]    09/24/2024    STOP: IF H&P IS GREATER THAN 30 DAYS FROM SURGICAL DAY COMPLETE NEW H&P IS REQUIRED.     H & P updated the day of the procedure.  1.  H&P completed within 30 days of surgical procedure and has been reviewed within 24 hours of admission but prior to surgery or a procedure requiring anesthesia services, the patient has been examined, and no change has occured in the patients condition since the H&P was completed.       Change in medications: No        No LMP recorded.      Comments:     2.  Patient continues to be appropriate candidate for planned surgical procedure. YES    Lucie ONEIDA Cleverly, DO

## 2024-09-24 NOTE — OR PostOp (Signed)
 Patient transported to Day surgery Room 10 via stretcher and room air. Nurse Rhoda Levonne PEAK present. Patient moved self from stretcher over to bed. Vitals as follows: 97.3 oral, 78, 131/70, 16 and 96% room air. Surgical peripad with scant drainage and new applied. IV patent. Patient awake and alert. No family in waiting area.

## 2024-09-24 NOTE — Anesthesia Preprocedure Evaluation (Signed)
 ANESTHESIA PRE-OP EVALUATION  Planned Procedure: ENDOMETRIAL BIOPSY, COLPOSCOPY, VULVAR LESION EXCISION  Review of Systems         patient summary reviewed          Pulmonary   current smoker and Smoked in last 24 hours,   Cardiovascular  negative cardio ROS,   ECG reviewed ,No peripheral edema,        GI/Hepatic/Renal   negative GI/hepatic/renal ROS,         Endo/Other    anemia,      Neuro/Psych/MS    seizures, CVA, headaches, depression, Substance use, marijuana    peripheral neuropathy,  Cancer                      Physical Assessment      Airway       Mallampati: I    TM distance: <3 FB    Neck ROM: full  Mouth Opening: good.            Dental           (+) upper dentures           Pulmonary    Breath sounds clear to auscultation  (-) no rhonchi, no decreased breath sounds, no wheezes, no rales and no stridor     Cardiovascular    Rhythm: regular  Rate: Normal  (-) no friction rub, carotid bruit is not present, no peripheral edema and no murmur     Other findings            Plan  ASA 3     Planned anesthesia type: general     general anesthesia with laryngeal mask airway      PONV Plan:  I plan to administer pharmcologic prophalaxis antiemetics  POV PLAN:   plan for postoperative opioid use            Intravenous induction     Anesthesia issues/risks discussed are: PONV, Post-op Pain Management, Cardiac Events/MI, Stroke and Aspiration.  Anesthetic plan and risks discussed with patient  signed consent obtained          Patient's NPO status is appropriate for Anesthesia.

## 2024-09-24 NOTE — OR Surgeon (Signed)
 [facility]  Operative Note    Patient Name: Jasmine Brown, Jasmine Brown [facility] Number: [id number]  Date of Service: 09/24/2024   Date of Birth: 1976-04-25      Pre-Operative Diagnosis: Vaginal lesion, cervical dysplasia, abnormal uterine bleeding    Post-Operative Diagnosis: Lesion of right labia minora, cervical dysplasia, abnormal uterine bleeding    Procedure(s)/Description:  ENDOMETRIAL BIOPSY, EXCISION OF RIGHT LABIAL LESION, CERVICAL BIOPSY: 58100 (CPT)       Findings:  Approximately 3 x 3 cm area of thickened abnormal tissue on the medial side of the right labia minora from the area of very urethra up to just below the clitoris.  This does cross the midline.  Cervix grossly normal in appearance.  Colposcope not available so 4 quadrant biopsy was done.  Anteverted uterus sounded to 7 cm.    Attending Surgeon: Lucie ONEIDA Cleverly, DO     Anesthesia:  Anesthesiologist: Renella Shove, MD  CRNA: Eliseo Mare, CRNA    Anesthesia Type: .General     First Assist: None    Estimated Blood Loss:  Minimal    Specimens Removed:   ID Type Source Tests Collected by Time Destination   1 : endometrial biopsy Tissue Endometrium SURGICAL PATHOLOGY SPECIMEN Cleverly Lucie T, DO 09/24/2024 1257    2 : #1 CERVICAL BIOPSY- 0200 Tissue Cervix SURGICAL PATHOLOGY SPECIMEN Cleverly Lucie T, DO 09/24/2024 1302    3 : #2 CERVICAL BIOPSY- 0400 Tissue Cervix SURGICAL PATHOLOGY SPECIMEN Cleverly Lucie T, DO 09/24/2024 1302    4 : #3 CERVICAL BIOPSY- 0800 Tissue Cervix SURGICAL PATHOLOGY SPECIMEN Cleverly Lucie T, DO 09/24/2024 1302    5 : #4 CERVICAL BIOPSY- 1000 Tissue Cervix SURGICAL PATHOLOGY SPECIMEN Cleverly Lucie T, DO 09/24/2024 1303    6 : RIGHT LABIA MINORA LESION Tissue Labium SURGICAL PATHOLOGY SPECIMEN Cleverly Lucie ONEIDA, DO 09/24/2024 1321       Order Name Source Comment Collection Info Order Time   HCG, URINE QUALITATIVE, PREGNANCY Urine, Site not specified  Collected By: Job Lauth, RN 09/24/2024  9:09  AM     Release to patient   Automated        SURGICAL PATHOLOGY SPECIMEN Endometrium Pre-op diagnosis:  Vaginal lesion Collected By: Cleverly Lucie ONEIDA, DO 09/24/2024 12:57 PM     Release to patient   Automated             Complications:  None      Condition: stable    Disposition: PACU - hemodynamically stable.    Description of Procedure:      After confirming the validity of consents, the patient was transported to the operating room and placed on the operating table after adequate general anesthesia, the patient's legs were placed in stirrups.  She was sterilely prepped and draped in the usual fashion.  Surgical timeout was performed.  Sims and Deaver were inserted into the vagina to visualize the cervix.  The cervix was then grasped on the anterior lip using a single-tooth tenaculum.  The endometrial Pipelle was then passed through the cervix and biopsy was collected.  The single-tooth tenaculum was removed and the bilateral puncture sites were noted to be hemostatic.      Cervix was then coated in Lugol solution.  No aceto-white changes appreciated the colposcope not available.  Four quadrant biopsy was taken and sent for pathology.  Monsel's solution was applied to cervix for hemostasis.    Right labial lesion as mentioned above.  Lidocaine  with epi was injected at  this site.  A circumferential incision was made around the grossly abnormal tissue using a 15 blade scalpel.  The tissue was then elevated and carefully dissected off underlying tissue.  Excision was then closed in 2 layers using 4-0 Vicryl.    The other instruments were removed from the vagina and the patient was awakened and transported to the PACU in stable condition.  All sponge and instrument counts were correct 2.  All specimen  were sent for pathologic evaluation.      Lucie ONEIDA Cleverly, DO

## 2024-09-24 NOTE — Anesthesia Transfer of Care (Signed)
 ANESTHESIA TRANSFER OF CARE   Jasmine Brown is a 48 y.o. ,female, Weight: 48.1 kg (106 lb)   had Procedure(s):  ENDOMETRIAL BIOPSY, EXCISION OF RIGHT LABIAL LESION, CERVICAL BIOPSY  performed  09/24/24   Primary Service: Lucie ONEIDA Cleverly, *    Past Medical History:   Diagnosis Date   . Anemia    . Anxiety state    . Breast mass     Multiple masses due to have a repeat 6 month Mammo 5/23   . Cervical cancer    . Depression    . High cholesterol    . HPV (human papilloma virus) infection    . Migraines    . Multiple lesions on CT of brain and spine    . Muscle spasm    . Neuropathy (CMS HCC)     tendons and muscles are fading away   . Seizures (CMS HCC)    . Stroke       Allergy History as of 09/24/24       CODEINE         Noted Status Severity Type Reaction    06/21/16 1529 [place] Graylin [place], [place] 06/21/16 Active                 SULFA (SULFONAMIDES)         Noted Status Severity Type Reaction    06/21/16 1529 W[place]ell Graylin [place], [place] 06/21/16 Active                 TRIAMTERENE         Noted Status Severity Type Reaction    06/21/16 1529 [place], [place] 06/21/16 Active                 FUROSEMIDE         Noted Status Severity Type Reaction    06/21/16 1529 [place] Gray[place], [place] 06/21/16 Active                 HYDROCHLOROTHIAZIDE         Noted Status Severity Type Reaction    06/21/16 1529 W[place] Gray[place], [place] 06/21/16 Active                 TRIMETHOPRIM         Noted Status Severity Type Reaction    06/21/16 1529 Waddell Graylin [place], [place] 06/21/16 Active                 SULFAMETHOXAZOLE-TRIMETHOPRIM         Noted Status Severity Type Reaction    06/21/16 1530 W[place] Graylin [place], [place] 06/21/16 Active                 DEXAMETHASONE         Noted Status Severity Type Reaction    06/21/16 1530 Waddell Graylin [place], [place] 06/21/16 Active                 ERENUMAB-AOOE         Noted Status Severity Type Reaction    03/12/19 1254 Jannetta Kiss, Ambulatory Care Assistant 03/12/19 Active                     I completed my transfer of care /  handoff to the receiving personnel during which we discussed:  Access, Airway, All key/critical aspects of case discussed, Analgesia, Antibiotics, Expectation of post procedure, Fluids/Product, Gave opportunity for questions and acknowledgement of understanding, Labs and PMHx  Post Location: PACU                                                           Last OR Temp: Temperature: 36.4 C (97.6 F)      Airway:  LMA (Active)     Blood pressure (!) 94/58, pulse 58, temperature 36.4 C (97.6 F), resp. rate 15, height 1.499 m (4' 11), weight 48.1 kg (106 lb), SpO2 100%, not currently breastfeeding.

## 2024-09-24 NOTE — H&P (Signed)
 Document sent from office to be scanned in

## 2024-09-24 NOTE — Anesthesia Postprocedure Evaluation (Signed)
 Anesthesia Post Op Evaluation    Patient: Jasmine Brown  Procedure(s):  ENDOMETRIAL BIOPSY, EXCISION OF RIGHT LABIAL LESION, CERVICAL BIOPSY    Last Vitals:Temperature: 36.4 C (97.6 F) (09/24/24 1347)  Heart Rate: 58 (09/24/24 1347)  BP (Non-Invasive): (!) 94/58 (09/24/24 1347)  Respiratory Rate: 15 (09/24/24 1347)  SpO2: 100 % (09/24/24 1347)    No notable events documented.    Patient is sufficiently recovered from the effects of anesthesia to participate in the evaluation and has returned to their pre-procedure level.  Patient location during evaluation: PACU       Patient participation: complete - patient participated  Level of consciousness: awake and alert and responsive to verbal stimuli    Pain management: adequate  Airway patency: patent    Anesthetic complications: no  Cardiovascular status: acceptable  Respiratory status: acceptable  Hydration status: acceptable  Patient post-procedure temperature: Pt Normothermic   PONV Status: Absent

## 2024-09-24 NOTE — Nurses Notes (Signed)
 PER SAM IN PHARMACY, PT CAN USE DERMAPLAST SPRAY UP TO 4 APPLICATIONS IN A 24 HOUR PERIOD. THIS INFORMATION WILL BE INCLUDED IN THE VERBAL DC INSTRUCTIONS GIVEN TO PT. ALSO WILL WRITE IT ON THE DC INSTRUCTIONS.. PT WILL ALSO GO HOME WITH A PERI BOTTLE TO USE AT HOME FOR COMFORT MEASURES. A PRESCRIPTION FOR ROXICODONE  GIVEN TO PT ALONG WITH COPY OF DC INSTRUCTIONS.. PT STATES THAT She IS OKAY WITH THAT PRESCRIPTION. She HAD  AN ADVERSE REACTION TO CODEINE WHEN She WAS YOUNGER.. STATES She HAD A VERY UPSET STOMACH AND PAIN

## 2024-09-25 LAB — SURGICAL PATHOLOGY SPECIMEN

## 2024-10-17 ENCOUNTER — Ambulatory Visit (INDEPENDENT_AMBULATORY_CARE_PROVIDER_SITE_OTHER): Payer: Self-pay | Admitting: NEUROLOGY

## 2024-10-17 ENCOUNTER — Other Ambulatory Visit: Payer: Self-pay

## 2024-10-17 DIAGNOSIS — R202 Paresthesia of skin: Secondary | ICD-10-CM

## 2024-10-17 NOTE — Procedures (Signed)
 NEUROLOGY, [facility]  [address]  [place] [address]    Procedure Note    Name: Jasmine Brown MRN:  [id number]   Date: 10/17/2024 DOB:  12-13-1975 (48 y.o.)         95861 - NEEDLE ELECTROMYOGRAPHY; 2 EXTREMITIES W/ OR W/O RELATED PARASPINAL AREAS (AMB ONLY)    Date/Time: 10/17/2024 11:39 AM    Performed by: Sherre Senior, DO  Authorized by: Sherre Senior, DO        ELECTROMYOGRAPHY REPORT    DATE OF SERVICE:  10/17/2024      PERFORMING PHYSICIAN:  CANDIE Arthea Sherre, D.O.      REFERRING PHYSICIAN:  CANDIE Arthea Sherre, D.O.    HISTORY:  She is a 48 year old woman who is referred for electrodiagnostic for longstanding paresthesias. See most recent visit note from August 19, 2024.     EXAMINATION:  See most recent visit note from August 19, 2024.     NERVE CONDUCTION STUDIES:  1.Normal right median-D2, ulnar-D5, radial-snuff box, and sural sensory nerve action potentials (SNAPs).  2.Normal right median-APB, ulnar-ADM, fibular-TA and tibial-AH compound muscle action potentials (CMAPs).  3.Normal right fibular-EDB motor amplitudes with mildly slowed conduction velocities below the fibular head and moderately prolonged distal motor latencies. This was felt to be secondary to cool limb temperatures as she was persistently at 28 degrees C.    ELECTROMYOGRAPHY:  Normal studies for the right deltoid, biceps, triceps, pronator teres, first dorsal interosseous (FDI), vastus-lateralis, tibialis anterior, and medial gastrocnemius.    IMPRESSION:  This is a normal study.  Specifically, there is no electrophysiologic evidence for a large fiber polyneuropathy at this time.     CLINICAL NOTE:   The above findings were discussed with the patient. She does report that her symptoms coincided with the initiation of Aimovig which caused her acutely to not be able to walk.     CANDIE Darlyn Sherre, DO  Assistant Professor of Neurology  [place]  [facility]

## 2024-11-13 ENCOUNTER — Ambulatory Visit (INDEPENDENT_AMBULATORY_CARE_PROVIDER_SITE_OTHER): Admitting: Clinical Neuropsychologist

## 2024-12-17 ENCOUNTER — Other Ambulatory Visit: Payer: Self-pay

## 2024-12-17 ENCOUNTER — Encounter (INDEPENDENT_AMBULATORY_CARE_PROVIDER_SITE_OTHER): Payer: Self-pay | Admitting: NEUROLOGY

## 2024-12-17 ENCOUNTER — Ambulatory Visit (INDEPENDENT_AMBULATORY_CARE_PROVIDER_SITE_OTHER): Payer: Self-pay | Admitting: NEUROLOGY

## 2024-12-17 VITALS — BP 102/75 | HR 91 | Temp 98.7°F | Ht 59.0 in | Wt 113.0 lb

## 2024-12-17 DIAGNOSIS — G259 Extrapyramidal and movement disorder, unspecified: Secondary | ICD-10-CM

## 2024-12-17 DIAGNOSIS — F445 Conversion disorder with seizures or convulsions: Secondary | ICD-10-CM

## 2024-12-17 DIAGNOSIS — G379 Demyelinating disease of central nervous system, unspecified: Secondary | ICD-10-CM | POA: Insufficient documentation

## 2024-12-17 NOTE — Nursing Note (Signed)
 Patient is scheduled for lumbar puncture on 01/01/25 arrive at 7:30 a.m. for 9:00 am told patient to stop her Aspirin 5 days prior to procedure- patient stated understanding

## 2024-12-17 NOTE — Progress Notes (Signed)
 NEUROLOGY, [facility]  [address]  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 These have been present to varying degrees for a number of years. Patient reports significant improvement with steroid treatment (methylprednisolone ) as well as with large volume lumbar punctures. Previous workup included have included CSF studies which have only been notable for increase in kappa free light chains but no oligoclonal banding specific to the CSF.  She has had an MRI of the brain in 2020 which clearly shows a small enhancing lesion in the right frontal lobe which resolved on later studies.  While I think the possibility of the neuro immunologic process still exists, I do not think it would explain the constellation of her symptoms which have no clear localizing features.  I do think it is reasonable for her to seek a 2nd opinion with neuro immunology given her previous imaging and CSF findings.  She would prefer to go to [facility]. She reports that she is allergic to over 21 medications but cannot recall them all at this time, limiting our options for headache treatment. Given the abnormal findings previously we will repeat LP with CSF studies while she awaits an appointment with [facility]. She presents with further new multitude of symptoms including skull flattening, headaches, muscle aches.  Encouraged follow-up with [facility] clinic -- she reports that she is getting multimodality cognitive and behavioral therapy  Refer to neuroimmunology at [facility] of [place]   Repeat LP with CSF studies  Return to clinic for electrodiagnostic testing      Thank you for allowing me to participate in your patient's care and please do not hesitate to  contact me for any questions or concerns.    Garnette Hard, MD  Assistant Professor of Neurology  [place]  [facility]     On the day of the encounter, a total of 40 minutes was spent on this patient encounter including review of historical information, examination, documentation and post-visit activities. The time documented excludes procedural time.  [id number]: I will continue to be the provider focal point in managing the chronic complex neurological condition    =====================================================================    NAME:  Jasmine Brown  DOB:  1975/12/30  VISIT DATE:  12/17/2024     CC:  Seizure-like activity, abnormal movements, abnormal MRI    Patient seen in consultation at the request of Delilah Gammon, FNP   History obtained from the patient and chart/records  Age of patient:  49 y.o.    I had the pleasure of seeing Jasmine Brown for outpatient consultation, who is a 49 y.o. year old female and is being seen for management of above CC.    INTERVAL:  12/17/2024: 49 year old female, presented with functional neurologic disorder manifesting as seizure-like episodes, movement disorders, and cognitive difficulties. Her history includes cervical cancer (recently treated), migraines, and significant weight loss. Examination revealed normal skull palpation despite patient's concerns about head shape changes following recent falls. The patient has gained 5 pounds on Remeron after previously weighing 83 pounds. Plan includes lumbar puncture with CSF studies, continuing magnesium  supplementation for muscle wasting, maintaining Remeron, and symptomatic management of headaches with ibuprofen  and Tylenol  while obtaining a comprehensive medication allergy list.    HPI:    08/19/2024: Since last visit, she feels that symptoms are worsening.  She is requesting a lumbar puncture, as they have improved her symptoms in the past.  Gait continues to be an issue.  She has also had  recurrence of urinary incontinence which has been an issue in the past.  Tremor and abnormal hand movements are not significant at this point.  She does plan to follow up with [facility] clinic in [place].    03/14/2024  Patient presents to neurology with multiple neurological symptoms including abnormal movements, balance issues, and cognitive difficulties. She reports a complex history of neurological symptoms that began suddenly without a clear inciting event.    The patient describes experiencing leg and arm movements, arm cramping, and abnormal movements. She also reports significant cognitive issues, including brain fog, difficulty forming thoughts, and speaking sentences backwards. For example, she may say cake cup instead of cupcake without realizing it. 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    Patient reports a history of seizure-like episodes, though she has not had a seizure in 2 years until recently. She does not lose consciousness during these episodes. She also experiences balance issues and depth perception problems, which have caused her to lose her legs several times in the past 3 weeks. She requires a shower chair and occasionally needs assistance to prevent falls.    - Symptoms improved with steroid treatment in the past, received every 3 months  - Progressed from using a walker to rarely needing a cane  - Has not received steroids for past 3 weeks with worsening symptoms  - Reports history of migraines and vitamin D  deficiency  - Diagnosed with hydrocephalus by previous neurologist  - Denies any traumatic event triggering symptoms    Medical History  - Functional neurologic disorder  - Post-traumatic stress disorder (PTSD)  - Migraines  - Vitamin D  deficiency  - Anemia  - Hydrocephalus  - Multiple hospitalizations with up to 18 seizures per day  - History of using  walker, progressing to cane    Medications and Supplements  - Cymbalta   - Valium   - Muscle relaxers  - Methylprednisolone  pack  - Topamax  (discontinued)  - Vitamin D  supplements    Allergies  - Topamax     Family History  - Brother: Currently undergoing Agent Orange testing, described as sick    Social History  - Occupation: Previously worked as a it consultant for 22 years  - Trauma History: Patient mentions PTSD    Review of Systems  - General: Fatigue, weight loss  - HEENT: Difficulty rinsing hair and washing face due to balance issues  - Musculoskeletal: Leg weakness, arm cramping, abnormal movements, loss of balance  - Neurological: Seizure-like episodes, brain fog, word reversal, difficulty with math and number comprehension  - Psychiatric: Mood changes    =====================================================================  PMHx  Patient Active Problem List   Diagnosis    Post herpetic neuralgia    CVA (cerebral vascular accident)    Migraines    Hydrocephalus    Psychogenic nonepileptic seizure    Benzodiazepine dependence (CMS HCC)    Seizure-like activity (CMS HCC)    Tobacco use disorder    Slurred speech    Cognitive decline    Functional movement disorder    Nonepileptic attack disorder    Abnormal MRI of head    Vitamin D  deficiency    Demyelinating changes in brain     Past Surgical History:  Procedure Laterality Date    DENTAL SURGERY      HX DILATION AND CURETTAGE      VULVA SURGERY           Family Medical History:       Problem Relation (Age of Onset)    Breast Cancer Mother (64), Maternal Aunt, Paternal Aunt    Cancer Paternal Uncle    Diabetes Mother, Paternal Grandmother    Heart Attack Maternal Grandfather, Paternal Grandfather    Hypertension (High Blood Pressure) Mother    Mental illness Mother    No Known Problems Father    Other Maternal Grandfather, Paternal Grandmother    Seizures Maternal Grandmother    Stroke Mother, Maternal Grandmother, Maternal Grandfather    Tremor Mother             Current Outpatient Medications   Medication Sig Dispense Refill    acetaminophen  (TYLENOL ) 500 mg Oral Tablet Take 2 Tablets (1,000 mg total) by mouth Every 6 hours as needed for Pain for up to 30 doses      ALPRAZolam  (XANAX ) 0.5 mg Oral Tablet Take 1 Tablet (0.5 mg total) by mouth Three times a day      aspirin (ECOTRIN) 81 mg Oral Tablet, Delayed Release (E.C.) Take 2 Tablets (162 mg total) by mouth Daily      baclofen (LIORESAL) 10 mg Oral Tablet Take 1 Tablet (10 mg total) by mouth Three times a day as needed      cholecalciferol , vitamin D3, 125 mcg (5,000 unit) Oral Capsule Take 1 Capsule (5,000 Units total) by mouth Daily (Patient not taking: Reported on 09/24/2024) 30 Capsule 2    DULoxetine  (CYMBALTA  DR) 60 mg Oral Capsule, Delayed Release(E.C.) Take 1 Capsule (60 mg total) by mouth Daily      ezetimibe (ZETIA) 10 mg Oral Tablet Take 1 Tablet (10 mg total) by mouth Every night      Ibuprofen  (MOTRIN ) 600 mg Oral Tablet Take 1 Tablet (600 mg total) by mouth Every 6 hours      magnesium  oxide 400 mg magnesium  Oral Tablet Take 1 Tab (400 mg total) by mouth Once a day      Mirtazapine (REMERON) 7.5 mg Oral Tablet Take 1 Tablet (7.5 mg total) by mouth Every night      mupirocin (BACTROBAN) 2 % Ointment  (Patient not taking: Reported on 12/17/2024)      spironolactone  (ALDACTONE ) 100 mg Oral Tablet Take 1 Tablet (100 mg total) by mouth Once per day as needed       No current facility-administered medications for this visit.     Allergies   Allergen Reactions    Aimovig Autoinjector [Erenumab-Aooe]     Bactrim [Sulfamethoxazole-Trimethoprim]     Codeine     Furosemide     Hctz [Hydrochlorothiazide]     Maxidex  [Dexamethasone ]     Sulfa (Sulfonamides)     Triamterene     Trimethoprim      Social History     Socioeconomic History    Marital status: Divorced     Spouse name: Shaunta Oncale    Number of children: 2    Years of education: 12    Highest education level: Not on file   Occupational History     Occupation: Real Estate   Tobacco Use    Smoking status: Every Day     Current packs/day: 1.00     Types: Cigarettes    Smokeless tobacco: Never   Vaping Use  Vaping status: Never Used   Substance and Sexual Activity    Alcohol  use: Not Currently    Drug use: Yes     Frequency: 7.0 times per week     Types: Marijuana     Comment: Uses daily, last use was yesterday    Sexual activity: Not on file   Other Topics Concern    Not on file   Social History Narrative    Not on file     Social Determinants of Health     Financial Resource Strain: Not on file   Transportation Needs: Not on file   Social Connections: Not on file   Intimate Partner Violence: Not on file   Housing Stability: Not on file       =====================================================================  GENERAL EXAMINATION  BP 102/75 (Site: Left Arm, Patient Position: Sitting, Cuff Size: Adult)   Pulse 91   Temp 37.1 C (98.7 F) (Temporal)   Ht 1.499 m (4' 11)   Wt 51.3 kg (113 lb)   SpO2 97%   BMI 22.82 kg/m     Vital signs personally reviewed    General: No acute distress, alert  HEENT: Normocephalic, no scleral icterus  Extremities: No significant edema, No cyanosis    NEUROLOGIC EXAM  MENTAL STATUS: alert and answering questions appropriately  CN  II: not directly tested, grossly intact  III, IV, VI: extraocular movements intact without nystagmus  V:  Reports diminished sensation throughout the right side to light touch  VII: face symmetric without weakness  VIII: grossly intact  IX, X: symmetric palatal elevation  XI: normal strength of trapezius and sternocleidomastoid bilaterally  XII: tongue midline with full movements    MOTOR  Bulk: normal  Tone: normal  Abnormal Movements:  No appreciable tremor today    Strength:  5/5 throughout there was some give-way weakness in the left side particularly    Reflexes: 2+ throughout    Sensory: Intact to Light Touch     Coordination: No dysmetria    Gait: no clear ataxia, does not wish to  complete tandem gait    =====================================================================  DATA  Personal review of prior labs is notable for:   VITAMIN B 12   Date Value Ref Range Status   10/09/2019 >2,000 (H) 200-1,000 pg/mL Final     Personal review of imaging (with independent interpretation) is notable for:   MRI Brain w/wo May 2025 - minimal scattered white matter ds    - MRI brain with and without contrast 08/19/2019:  Punctate enhancing lesion of the right frontal lobe  - MRI brain with and without contrast 01/02/2020:  Resolution punctate lesion, few scattered punctate T2 FLAIR hyperintensities  - MRI C-spine with and without contrast 08/17/2019:  Unremarkable  - MRI T-spine with and without contrast 09/20/2019:  T5 hemangioma, otherwise unremarkable  Personal Review of other prior diagnostics is notable for: not available for review   =====================================================================    Orders  Orders Placed This Encounter    CSF CULTURE WITH GRAM STAIN    PROTEIN CSF    GLUCOSE CSF    BODY FLUID CELL COUNT WITH DIFFERENTIAL - [place] ONLY - CSF    MULTIPLE SCLEROSIS (MS) PROFILE, SERUM AND SPINAL FLUID    IR LUMBAR PUNCTURE

## 2024-12-24 ENCOUNTER — Telehealth (INDEPENDENT_AMBULATORY_CARE_PROVIDER_SITE_OTHER): Payer: Self-pay | Admitting: NEUROLOGY

## 2024-12-24 NOTE — Telephone Encounter (Signed)
 Patient called and she was on AI bc her phone did an update and she said that the diagnosis  Etherdanols came up and she thinks this is what she has. She said she measured the roof of her mouth and it goes up far, she can touch her nose with her tongue, and the her skin just picks up off of her bones. Told her she needed to speak to her PCP for this problem.

## 2025-01-01 ENCOUNTER — Ambulatory Visit

## 2025-01-05 ENCOUNTER — Ambulatory Visit

## 2025-04-22 ENCOUNTER — Encounter (INDEPENDENT_AMBULATORY_CARE_PROVIDER_SITE_OTHER): Payer: Self-pay | Admitting: NEUROLOGY
